# Patient Record
Sex: Female | Born: 1965 | Race: White | Hispanic: No | Smoking: Former smoker
Health system: Southern US, Community
[De-identification: ages and names within clinical notes are randomized; demographics above are authoritative.]

## PROBLEM LIST (undated history)

## (undated) DIAGNOSIS — Z87442 Personal history of urinary calculi: Secondary | ICD-10-CM

## (undated) DIAGNOSIS — Z8489 Family history of other specified conditions: Secondary | ICD-10-CM

## (undated) DIAGNOSIS — F32A Depression, unspecified: Secondary | ICD-10-CM

## (undated) DIAGNOSIS — M1990 Unspecified osteoarthritis, unspecified site: Secondary | ICD-10-CM

## (undated) HISTORY — PX: FRACTURE SURGERY: SHX138

## (undated) HISTORY — PX: WISDOM TOOTH EXTRACTION: SHX21

## (undated) HISTORY — PX: CHOLECYSTECTOMY: SHX55

## (undated) HISTORY — PX: ANKLE FRACTURE SURGERY: SHX122

---

## 2018-04-27 DIAGNOSIS — M546 Pain in thoracic spine: Secondary | ICD-10-CM | POA: Diagnosis not present

## 2018-04-27 DIAGNOSIS — M9902 Segmental and somatic dysfunction of thoracic region: Secondary | ICD-10-CM | POA: Diagnosis not present

## 2018-04-27 DIAGNOSIS — M5414 Radiculopathy, thoracic region: Secondary | ICD-10-CM | POA: Diagnosis not present

## 2018-06-15 DIAGNOSIS — L989 Disorder of the skin and subcutaneous tissue, unspecified: Secondary | ICD-10-CM | POA: Diagnosis not present

## 2018-06-15 DIAGNOSIS — E559 Vitamin D deficiency, unspecified: Secondary | ICD-10-CM | POA: Diagnosis not present

## 2018-06-15 DIAGNOSIS — E785 Hyperlipidemia, unspecified: Secondary | ICD-10-CM | POA: Diagnosis not present

## 2021-02-25 ENCOUNTER — Other Ambulatory Visit: Payer: Self-pay | Admitting: Orthopedic Surgery

## 2021-02-25 ENCOUNTER — Other Ambulatory Visit (HOSPITAL_COMMUNITY): Payer: Self-pay | Admitting: Orthopedic Surgery

## 2021-02-25 DIAGNOSIS — M75101 Unspecified rotator cuff tear or rupture of right shoulder, not specified as traumatic: Secondary | ICD-10-CM

## 2021-02-25 DIAGNOSIS — M25562 Pain in left knee: Secondary | ICD-10-CM

## 2021-02-25 DIAGNOSIS — S82142A Displaced bicondylar fracture of left tibia, initial encounter for closed fracture: Secondary | ICD-10-CM

## 2021-03-02 ENCOUNTER — Ambulatory Visit
Admission: RE | Admit: 2021-03-02 | Discharge: 2021-03-02 | Disposition: A | Discharge status: Home / Self Care | Payer: BLUE CROSS/BLUE SHIELD | Source: Ambulatory Visit | Attending: Orthopedic Surgery | Admitting: Orthopedic Surgery

## 2021-03-02 ENCOUNTER — Other Ambulatory Visit: Payer: Self-pay

## 2021-03-02 DIAGNOSIS — M25562 Pain in left knee: Secondary | ICD-10-CM

## 2021-03-02 DIAGNOSIS — S82142A Displaced bicondylar fracture of left tibia, initial encounter for closed fracture: Secondary | ICD-10-CM | POA: Insufficient documentation

## 2021-03-02 DIAGNOSIS — M75101 Unspecified rotator cuff tear or rupture of right shoulder, not specified as traumatic: Secondary | ICD-10-CM | POA: Diagnosis not present

## 2021-03-02 IMAGING — MR MR KNEE*L* W/O CM
6 series · 40 of 40 positions shown · non-contrast
Comparison: Left knee x-ray report dated [DATE].

CLINICAL DATA: Chronic intermittent left knee pain. No prior
surgery.

EXAM:
MRI OF THE LEFT KNEE WITHOUT CONTRAST
TECHNIQUE: Multiplanar, multisequence MR imaging of the knee was performed. No
intravenous contrast was administered.

[Series 8: T2 fat-sat · axial · left · 4.0mm · 0.50mm/px · z∈[-90,+34]mm · 6 of 26 slices shown (1 of 3)]
[im 1/26]
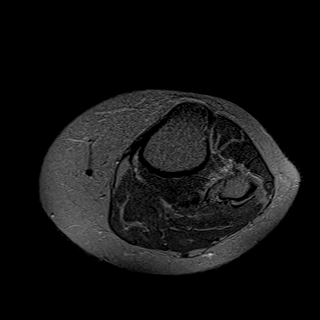
[im 6/26]
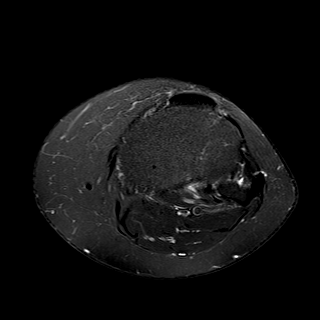
[im 11/26]
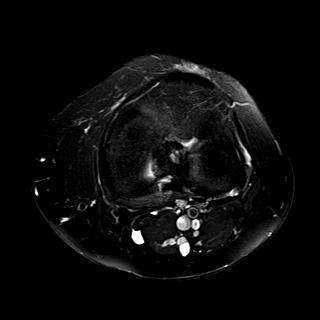
[im 16/26]
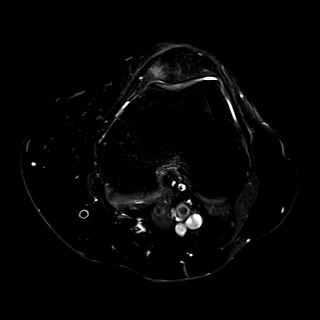
[im 21/26]
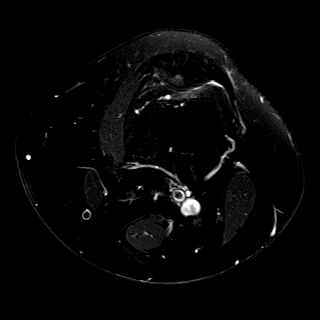
[im 26/26]
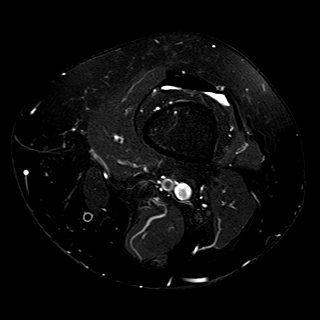

[Series 9: T2 fat-sat · coronal · left · 4.0mm · 0.59mm/px · 7 of 23 slices shown (2 of 3)]
[im 1/23]
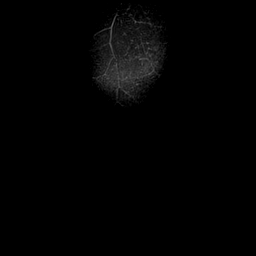
[im 4/23]
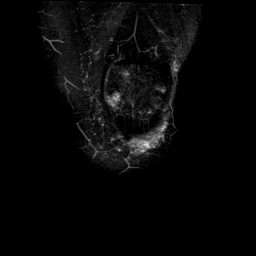
[im 8/23]
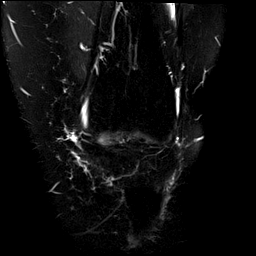
[im 12/23]
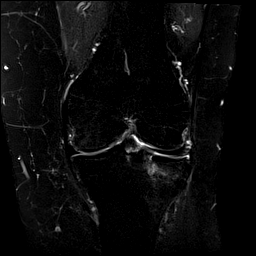
[im 15/23]
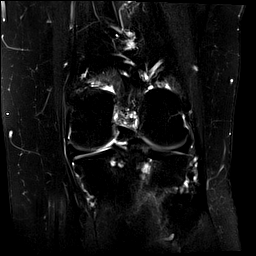
[im 19/23]
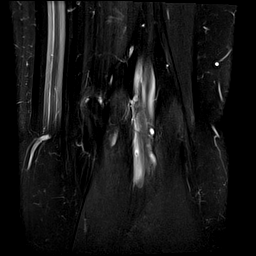
[im 23/23]
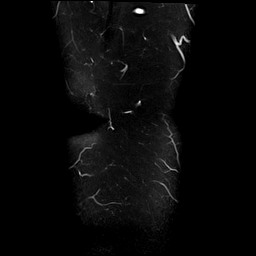

[Series 10: T1 · coronal · left · 4.0mm · 0.59mm/px · 7 of 24 slices shown]
[im 1/24]
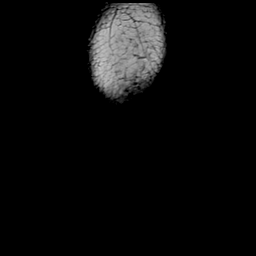
[im 4/24]
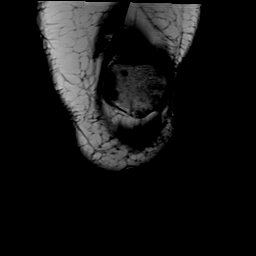
[im 8/24]
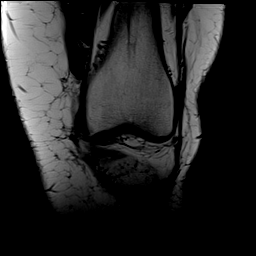
[im 12/24]
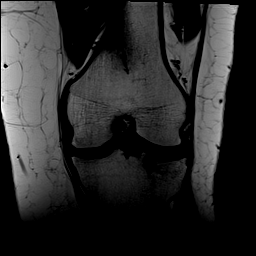
[im 16/24]
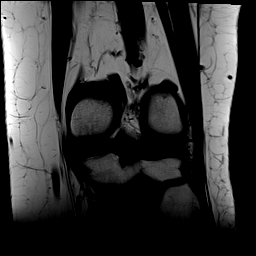
[im 20/24]
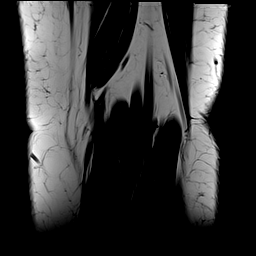
[im 24/24]
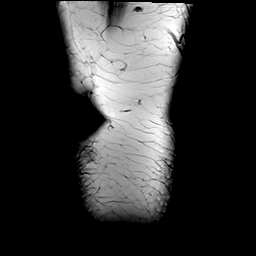

[Series 11: PD fat-sat · coronal · left · 4.0mm · 0.59mm/px · 7 of 24 slices shown]
[im 1/24]
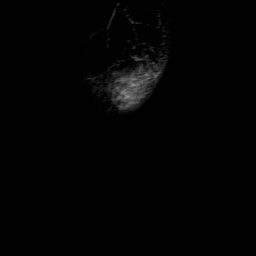
[im 4/24]
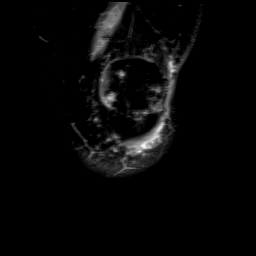
[im 8/24]
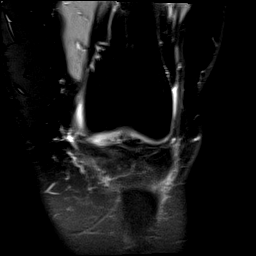
[im 12/24]
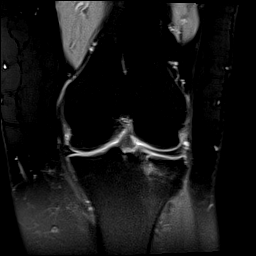
[im 16/24]
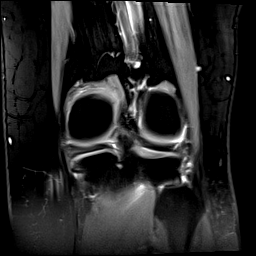
[im 20/24]
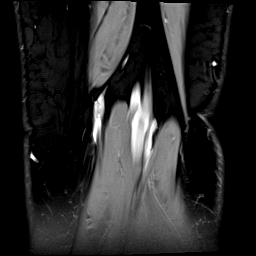
[im 24/24]
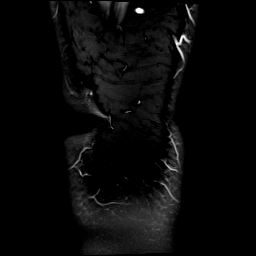

[Series 13: T2 fat-sat · sagittal · left · 3.0mm · 0.59mm/px · 8 of 28 slices shown (3 of 3)]
[im 1/28]
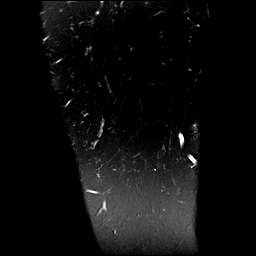
[im 4/28]
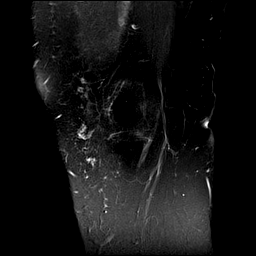
[im 8/28]
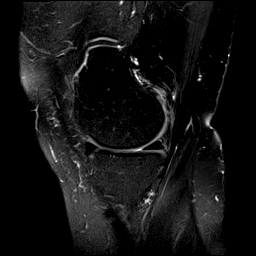
[im 12/28]
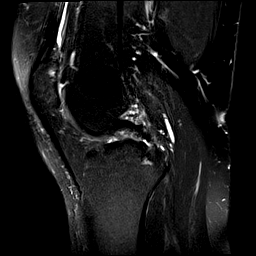
[im 16/28]
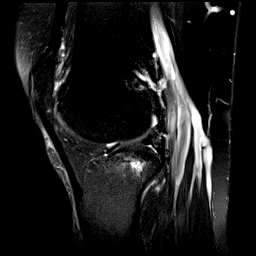
[im 20/28]
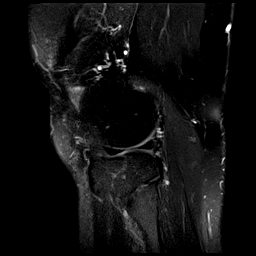
[im 24/28]
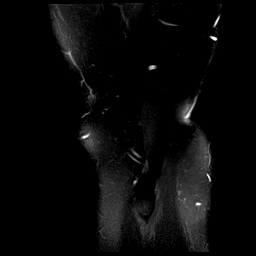
[im 28/28]
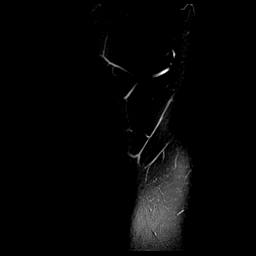

[Series 14: PD · coronal · left · 2.0mm · 0.47mm/px · 5 of 16 slices shown]
[im 1/16]
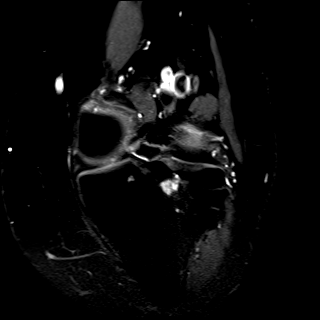
[im 4/16]
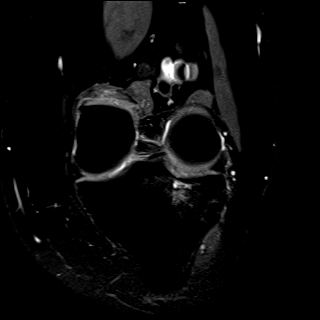
[im 8/16]
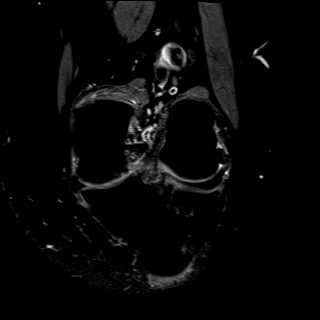
[im 12/16]
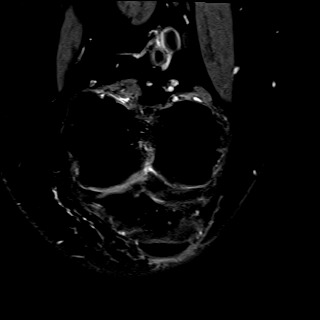
[im 16/16]
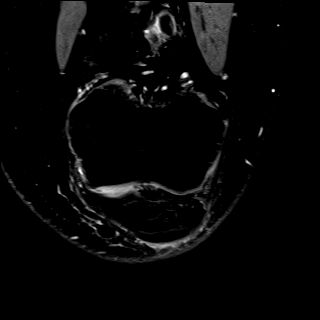

[40 of 40 positions shown; findings below may reference images not displayed]

FINDINGS: MENISCI

Medial meniscus:  Intact.

Lateral meniscus:  Intact.

LIGAMENTS

Cruciates:  Intact ACL and PCL.

Collaterals: Medial collateral ligament is intact. Lateral
collateral ligament complex is intact.

CARTILAGE

Patellofemoral: Scattered near full-thickness cartilage loss over
the medial and lateral patellar facets.

Medial: Mild partial-thickness cartilage loss over the central
weight-bearing medial femoral condyle.

Lateral:  No chondral defect.

Joint:  No joint effusion. Normal Hoffa's fat. No plical thickening.

Popliteal Fossa:  Tiny Baker cyst.  Intact popliteus tendon.

Extensor Mechanism: Intact quadriceps tendon and patellar tendon.
Intact medial and lateral patellar retinaculum. Intact MPFL.

Bones: Chronic appearing minimally depressed fracture of the
anterior lateral tibial plateau. 1.2 cm intraosseous ganglion cyst
in the posterior aspect of the lateral tibial plateau with mild
surrounding reactive marrow edema. No acute fracture or dislocation.
No suspicious bone lesion.

Other: None.
IMPRESSION: 1. Chronic appearing minimally depressed fracture of the anterior
lateral tibial plateau.
2. No meniscal or ligamentous injury.
3. Mild medial and patellofemoral compartment osteoarthritis.
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
## 2021-05-21 ENCOUNTER — Inpatient Hospital Stay: Admission: RE | Admit: 2021-05-21 | Payer: BLUE CROSS/BLUE SHIELD | Source: Ambulatory Visit

## 2021-06-27 ENCOUNTER — Other Ambulatory Visit: Payer: Self-pay | Admitting: Surgery

## 2021-07-07 ENCOUNTER — Other Ambulatory Visit: Payer: Self-pay

## 2021-07-07 ENCOUNTER — Encounter
Admission: RE | Admit: 2021-07-07 | Discharge: 2021-07-07 | Disposition: A | Discharge status: Home / Self Care | Payer: BLUE CROSS/BLUE SHIELD | Source: Ambulatory Visit | Attending: Surgery | Admitting: Surgery

## 2021-07-07 HISTORY — DX: Personal history of urinary calculi: Z87.442

## 2021-07-07 NOTE — Patient Instructions (Addendum)
Your procedure is scheduled on: 07/16/2021  Report to the Registration Desk on the 1st floor of the [facility]. To find out your arrival time, please call [phone] between 1PM - 3PM on: 07/15/2021  REMEMBER: Instructions that are not followed completely may result in serious medical risk, up to and including death; or upon the discretion of your surgeon and anesthesiologist your surgery may need to be rescheduled.  Do not eat food after midnight the night before surgery.  No gum chewing, lozengers or hard candies.  You may however, drink CLEAR liquids up to 2 hours before you are scheduled to arrive for your surgery. Do not drink anything within 2 hours of your scheduled arrival time.  Clear liquids include: - water  - apple juice without pulp - gatorade (not RED, PURPLE, OR BLUE) - black coffee or tea (Do NOT add milk or creamers to the coffee or tea) Do NOT drink anything that is not on this list.   In addition, your doctor has ordered for you to drink the provided  Ensure Pre-Surgery Clear Carbohydrate Drink  Drinking this carbohydrate drink up to two hours before surgery helps to reduce insulin resistance and improve patient outcomes. Please complete drinking 2 hours prior to scheduled arrival time.   One week prior to surgery: Stop Anti-inflammatories (NSAIDS) such as Advil, Aleve, Ibuprofen, Motrin, Naproxen, Naprosyn and Aspirin based products such as Excedrin, Goodys Powder, BC Powder and Mobic  Stop ANY OVER THE COUNTER supplements until after surgery. You may however, continue to take Tylenol if needed for pain up until the day of surgery.  Take this medication the morning of surgery: Flexeril Lexapro Gabapentin    No Alcohol for 24 hours before or after surgery.  No Smoking including e-cigarettes for 24 hours prior to surgery.  No chewable tobacco products for at least 6 hours prior to surgery.  No nicotine patches on the day of surgery.  Do not use any  "recreational" drugs for at least a week prior to your surgery.  Please be advised that the combination of cocaine and anesthesia may have negative outcomes, up to and including death. If you test positive for cocaine, your surgery will be cancelled.  On the morning of surgery brush your teeth with toothpaste and water, you may rinse your mouth with mouthwash if you wish. Do not swallow any toothpaste or mouthwash.  Use CHG Soap or wipes as directed on instruction sheet.  Do not wear jewelry, make-up, hairpins, clips or nail polish.  Do not wear lotions, powders, or perfumes.   Do not shave body from the neck down 48 hours prior to surgery just in case you cut yourself which could leave a site for infection.  Also, freshly shaved skin may become irritated if using the CHG soap.  Contact lenses, hearing aids and dentures may not be worn into surgery.  Do not bring valuables to the hospital. [facility] is not responsible for any missing/lost belongings or valuables.    Notify your doctor if there is any change in your medical condition (cold, fever, infection).  Wear comfortable clothing (specific to your surgery type) to the hospital.  After surgery, you can help prevent lung complications by doing breathing exercises.  Take deep breaths and cough every 1-2 hours. Your doctor may order a device called an Incentive Spirometer to help you take deep breaths.  If you are being admitted to the hospital overnight, leave your suitcase in the car. After surgery it may  be brought to your room.  If you are being discharged the day of surgery, you will not be allowed to drive home. You will need a responsible adult (18 years or older) to drive you home and stay with you that night.   If you are taking public transportation, you will need to have a responsible adult (18 years or older) with you. Please confirm with your physician that it is acceptable to use public transportation.   Please  call the Pre-admissions Testing Dept. at [phone] if you have any questions about these instructions.  Surgery Visitation Policy:  Patients undergoing a surgery or procedure may have one family member or support person with them as long as that person is not COVID-19 positive or experiencing its symptoms.  That person may remain in the waiting area during the procedure and may rotate out with other people.

## 2021-07-14 NOTE — Progress Notes (Signed)
Call to patient to see if she was coming to pre-admit testing department to pick up her pre-op bag with surgical instructions, soap and drink. Patient stated that she was going to put off surgery until maybe February 2023. Stated that she was about to call Dr. Binnie Rail office to let them know.

## 2021-07-16 ENCOUNTER — Encounter: Admission: RE | Payer: Self-pay | Source: Ambulatory Visit

## 2021-07-16 ENCOUNTER — Ambulatory Visit: Admission: RE | Admit: 2021-07-16 | Payer: BLUE CROSS/BLUE SHIELD | Source: Ambulatory Visit | Admitting: Surgery

## 2021-07-16 SURGERY — SHOULDER ARTHROSCOPY WITH SUBACROMIAL DECOMPRESSION, ROTATOR CUFF REPAIR AND BICEP TENDON REPAIR
Anesthesia: Choice | Site: Shoulder | Laterality: Right

## 2021-09-01 ENCOUNTER — Other Ambulatory Visit: Payer: Self-pay | Admitting: Surgery

## 2021-09-11 ENCOUNTER — Other Ambulatory Visit: Payer: Self-pay

## 2021-09-11 ENCOUNTER — Other Ambulatory Visit
Admission: RE | Admit: 2021-09-11 | Discharge: 2021-09-11 | Disposition: A | Discharge status: Home / Self Care | Payer: BLUE CROSS/BLUE SHIELD | Source: Ambulatory Visit | Attending: Surgery | Admitting: Surgery

## 2021-09-11 HISTORY — DX: Unspecified osteoarthritis, unspecified site: M19.90

## 2021-09-11 HISTORY — DX: Depression, unspecified: F32.A

## 2021-09-11 NOTE — Patient Instructions (Addendum)
Your procedure is scheduled on: 09/18/21 - Thursday Report to the Registration Desk on the 1st floor of the [facility]. To find out your arrival time, please call [phone] between 1PM - 3PM on: 09/17/21 - Wednesday  REMEMBER: Instructions that are not followed completely may result in serious medical risk, up to and including death; or upon the discretion of your surgeon and anesthesiologist your surgery may need to be rescheduled.  Do not eat food after midnight the night before surgery.  No gum chewing, lozengers or hard candies.  You may however, drink CLEAR liquids up to 2 hours before you are scheduled to arrive for your surgery. Do not drink anything within 2 hours of your scheduled arrival time.  Clear liquids include: - water  - apple juice without pulp - gatorade (not RED, PURPLE, OR BLUE) - black coffee or tea (Do NOT add milk or creamers to the coffee or tea) Do NOT drink anything that is not on this list.  Type 1 and Type 2 diabetics should only drink water.  In addition, your doctor has ordered for you to drink the provided  Ensure Pre-Surgery Clear Carbohydrate Drink  Drinking this carbohydrate drink up to two hours before surgery helps to reduce insulin resistance and improve patient outcomes. Please complete drinking 2 hours prior to scheduled arrival time.  TAKE THESE MEDICATIONS THE MORNING OF SURGERY WITH A SIP OF WATER: NONE  One week prior to surgery: Stop Anti-inflammatories (NSAIDS) such as Advil, Aleve, Ibuprofen, Motrin, Naproxen, Naprosyn and Aspirin based products such as Excedrin, Goodys Powder, BC Powder.  Stop ANY OVER THE COUNTER supplements until after surgery.  You may however, continue to take Tylenol if needed for pain up until the day of surgery.  No Alcohol for 24 hours before or after surgery.  No Smoking including e-cigarettes for 24 hours prior to surgery.  No chewable tobacco products for at least 6 hours prior to surgery.  No  nicotine patches on the day of surgery.  Do not use any "recreational" drugs for at least a week prior to your surgery.  Please be advised that the combination of cocaine and anesthesia may have negative outcomes, up to and including death. If you test positive for cocaine, your surgery will be cancelled.  On the morning of surgery brush your teeth with toothpaste and water, you may rinse your mouth with mouthwash if you wish. Do not swallow any toothpaste or mouthwash.  Do not wear jewelry, make-up, hairpins, clips or nail polish.  Do not wear lotions, powders, or perfumes.   Do not shave body from the neck down 48 hours prior to surgery just in case you cut yourself which could leave a site for infection.  Also, freshly shaved skin may become irritated if using the CHG soap.  Contact lenses, hearing aids and dentures may not be worn into surgery.  Do not bring valuables to the hospital. [facility] is not responsible for any missing/lost belongings or valuables.   Notify your doctor if there is any change in your medical condition (cold, fever, infection).  Wear comfortable clothing (specific to your surgery type) to the hospital.  After surgery, you can help prevent lung complications by doing breathing exercises.  Take deep breaths and cough every 1-2 hours. Your doctor may order a device called an Incentive Spirometer to help you take deep breaths. When coughing or sneezing, hold a pillow firmly against your incision with both hands. This is called splinting. Doing this  helps protect your incision. It also decreases belly discomfort.  If you are being admitted to the hospital overnight, leave your suitcase in the car. After surgery it may be brought to your room.  If you are being discharged the day of surgery, you will not be allowed to drive home. You will need a responsible adult (18 years or older) to drive you home and stay with you that night.   If you are taking public  transportation, you will need to have a responsible adult (18 years or older) with you. Please confirm with your physician that it is acceptable to use public transportation.   Please call the Pre-admissions Testing Dept. at [phone] if you have any questions about these instructions.  Surgery Visitation Policy:  Patients undergoing a surgery or procedure may have one family member or support person with them as long as that person is not COVID-19 positive or experiencing its symptoms.  That person may remain in the waiting area during the procedure and may rotate out with other people.  Inpatient Visitation:    Visiting hours are 7 a.m. to 8 p.m. Up to two visitors ages 16+ are allowed at one time in a patient room. The visitors may rotate out with other people during the day. Visitors must check out when they leave, or other visitors will not be allowed. One designated support person may remain overnight. The visitor must pass COVID-19 screenings, use hand sanitizer when entering and exiting the patients room and wear a mask at all times, including in the patients room. Patients must also wear a mask when staff or their visitor are in the room. Masking is required regardless of vaccination status.   Transportation Services:    - [facility] - [phone]  - [place]

## 2021-09-18 ENCOUNTER — Emergency Department: Payer: BLUE CROSS/BLUE SHIELD

## 2021-09-18 ENCOUNTER — Ambulatory Visit: Payer: BLUE CROSS/BLUE SHIELD | Admitting: Certified Registered"

## 2021-09-18 ENCOUNTER — Encounter
Admission: RE | Disposition: A | Discharge status: Home / Self Care | Payer: Self-pay | Source: Ambulatory Visit | Attending: Surgery

## 2021-09-18 ENCOUNTER — Ambulatory Visit
Admission: RE | Admit: 2021-09-18 | Discharge: 2021-09-18 | Disposition: A | Discharge status: Home / Self Care | Payer: BLUE CROSS/BLUE SHIELD | Source: Ambulatory Visit | Attending: Surgery | Admitting: Surgery

## 2021-09-18 ENCOUNTER — Emergency Department
Admission: EM | Admit: 2021-09-18 | Discharge: 2021-09-18 | Disposition: A | Discharge status: Home / Self Care | Payer: BLUE CROSS/BLUE SHIELD | Source: Home / Self Care

## 2021-09-18 ENCOUNTER — Encounter: Payer: Self-pay | Admitting: *Deleted

## 2021-09-18 ENCOUNTER — Encounter: Payer: Self-pay | Admitting: Surgery

## 2021-09-18 ENCOUNTER — Other Ambulatory Visit: Payer: Self-pay

## 2021-09-18 ENCOUNTER — Ambulatory Visit: Payer: BLUE CROSS/BLUE SHIELD

## 2021-09-18 DIAGNOSIS — M25511 Pain in right shoulder: Secondary | ICD-10-CM | POA: Insufficient documentation

## 2021-09-18 DIAGNOSIS — S46011A Strain of muscle(s) and tendon(s) of the rotator cuff of right shoulder, initial encounter: Secondary | ICD-10-CM | POA: Diagnosis not present

## 2021-09-18 DIAGNOSIS — F32A Depression, unspecified: Secondary | ICD-10-CM | POA: Insufficient documentation

## 2021-09-18 DIAGNOSIS — Z5321 Procedure and treatment not carried out due to patient leaving prior to being seen by health care provider: Secondary | ICD-10-CM | POA: Insufficient documentation

## 2021-09-18 DIAGNOSIS — R0602 Shortness of breath: Secondary | ICD-10-CM | POA: Insufficient documentation

## 2021-09-18 DIAGNOSIS — M7061 Trochanteric bursitis, right hip: Secondary | ICD-10-CM | POA: Insufficient documentation

## 2021-09-18 DIAGNOSIS — R079 Chest pain, unspecified: Secondary | ICD-10-CM | POA: Insufficient documentation

## 2021-09-18 DIAGNOSIS — Z87828 Personal history of other (healed) physical injury and trauma: Secondary | ICD-10-CM | POA: Insufficient documentation

## 2021-09-18 DIAGNOSIS — M7521 Bicipital tendinitis, right shoulder: Secondary | ICD-10-CM | POA: Diagnosis not present

## 2021-09-18 DIAGNOSIS — Z8616 Personal history of COVID-19: Secondary | ICD-10-CM | POA: Diagnosis not present

## 2021-09-18 DIAGNOSIS — Z419 Encounter for procedure for purposes other than remedying health state, unspecified: Secondary | ICD-10-CM

## 2021-09-18 DIAGNOSIS — M7541 Impingement syndrome of right shoulder: Secondary | ICD-10-CM | POA: Diagnosis present

## 2021-09-18 HISTORY — PX: SHOULDER ARTHROSCOPY WITH DEBRIDEMENT AND BICEP TENDON REPAIR: SHX5690

## 2021-09-18 HISTORY — PX: STERIOD INJECTION: SHX5046

## 2021-09-18 LAB — BASIC METABOLIC PANEL
Anion gap: 12 (ref 5–15)
BUN: 15 mg/dL (ref 6–20)
CO2: 21 mmol/L — ABNORMAL LOW (ref 22–32)
Calcium: 9.1 mg/dL (ref 8.9–10.3)
Chloride: 104 mmol/L (ref 98–111)
Creatinine, Ser: 0.88 mg/dL (ref 0.44–1.00)
GFR, Estimated: >60 mL/min (ref >60)
Glucose, Bld: 250 mg/dL — ABNORMAL HIGH (ref 70–99)
Potassium: 3.8 mmol/L (ref 3.5–5.1)
Sodium: 137 mmol/L (ref 135–145)

## 2021-09-18 LAB — CBC
HCT: 42.2 % (ref 36.0–46.0)
Hemoglobin: 14.1 g/dL (ref 12.0–15.0)
MCH: 30.1 pg (ref 26.0–34.0)
MCHC: 33.4 g/dL (ref 30.0–36.0)
MCV: 90 fL (ref 80.0–100.0)
Platelets: 315 10*3/uL (ref 150–400)
RBC: 4.69 MIL/uL (ref 3.87–5.11)
RDW: 12.8 % (ref 11.5–15.5)
WBC: 12.4 10*3/uL — ABNORMAL HIGH (ref 4.0–10.5)
nRBC: 0 % (ref 0.0–0.2)

## 2021-09-18 LAB — TROPONIN I (HIGH SENSITIVITY): Troponin I (High Sensitivity): 3 ng/L (ref <18)

## 2021-09-18 IMAGING — CR DG CHEST 1V
1 series · 1 of 1 positions shown · non-contrast
Comparison: None.

CLINICAL DATA: Dyspnea, right shoulder surgery

EXAM:
CHEST  1 VIEW

[dg chest 2 view]
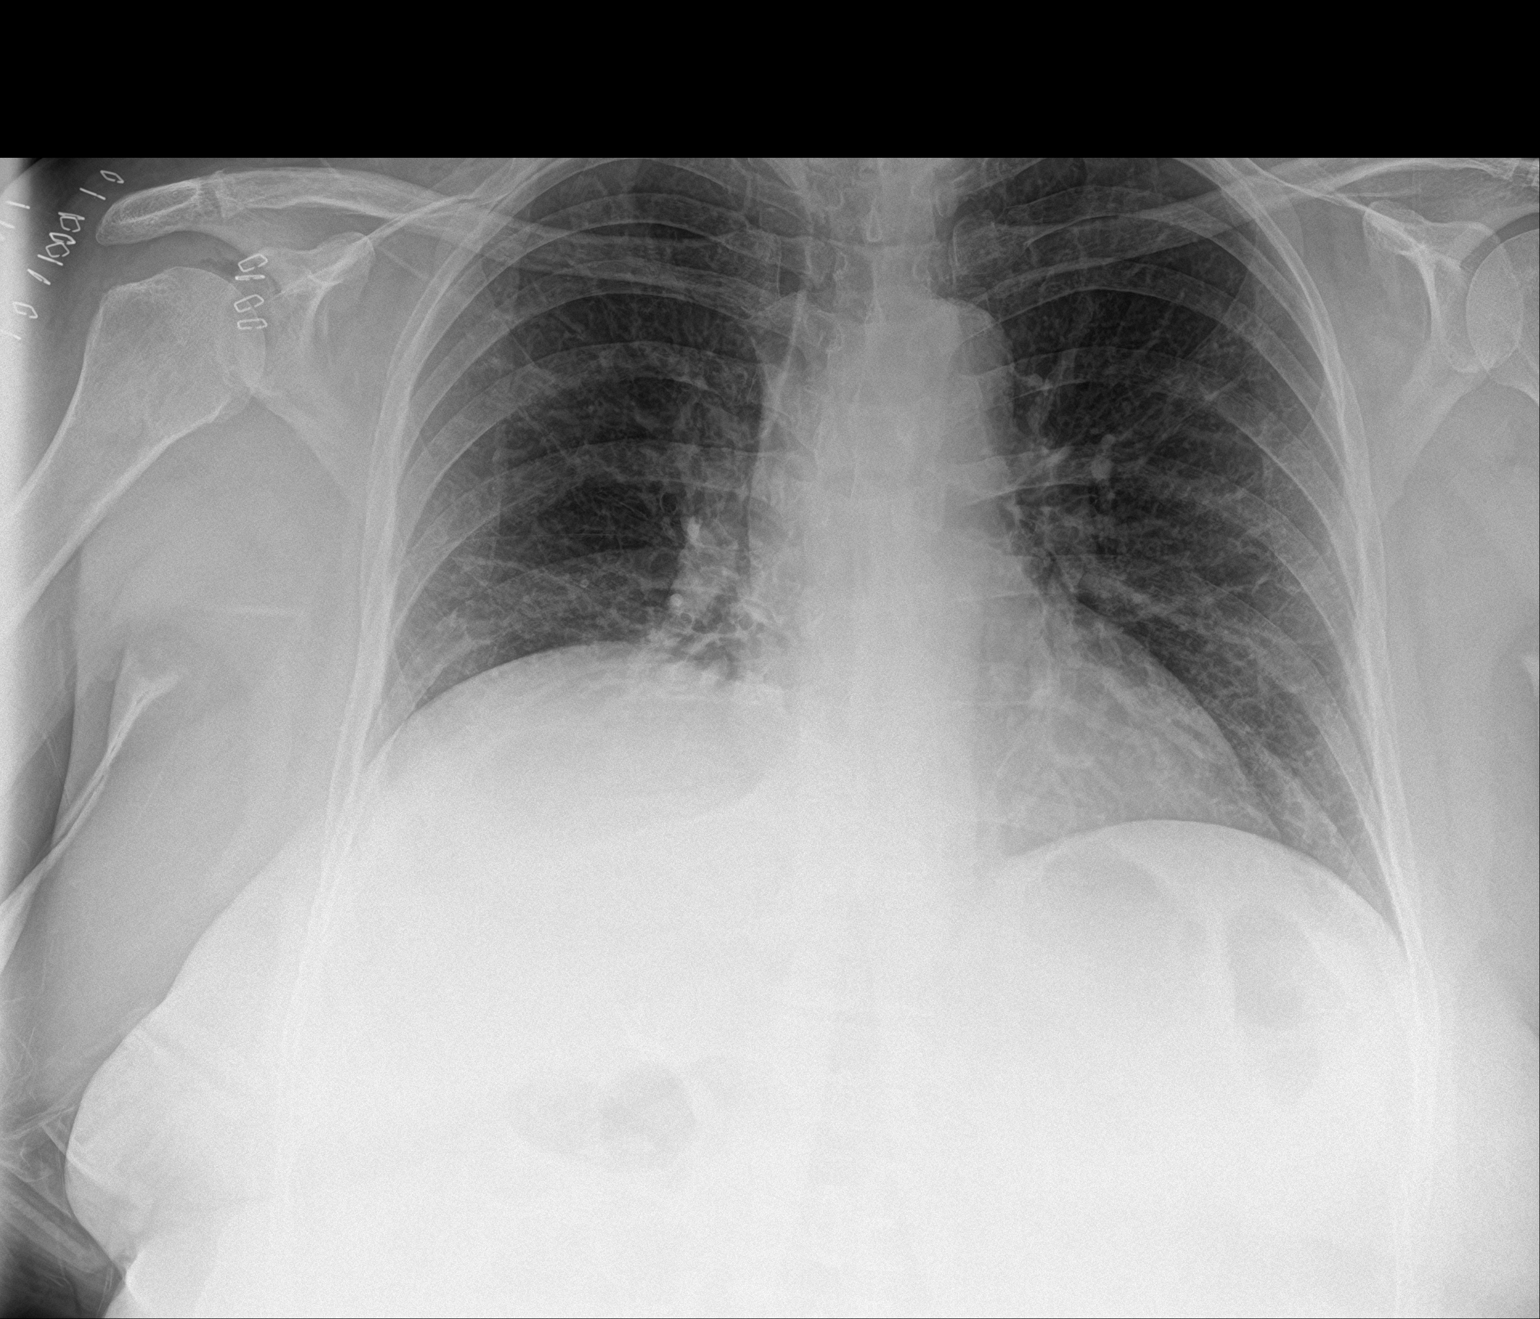

[1 of 1 positions shown; findings below may reference images not displayed]

FINDINGS: Mild elevation right hemidiaphragm noted with associated mild right
basilar atelectasis. Lungs are otherwise clear. No pneumothorax or
pleural effusion. Cardiac size within normal limits. Pulmonary
vascularity is normal. Surgical skin staples overlie the right
shoulder. Intra-articular gas is seen within the right glenohumeral
joint.
IMPRESSION: Mild elevation of the right hemidiaphragm with right basilar
atelectasis.

## 2021-09-18 SURGERY — SHOULDER ARTHROSCOPY WITH DEBRIDEMENT AND BICEP TENDON REPAIR
Anesthesia: General | Site: Shoulder | Laterality: Right

## 2021-09-18 MED ORDER — BUPIVACAINE HCL (PF) 0.5 % IJ SOLN
INTRAMUSCULAR | Status: DC | PRN
  Administered 2021-09-18: 10 mL via PERINEURAL

## 2021-09-18 MED ORDER — ONDANSETRON HCL 4 MG/2ML IJ SOLN: 4.0000 mg | Freq: Four times a day (QID) | INTRAMUSCULAR | Status: DC | PRN

## 2021-09-18 MED ORDER — RINGERS IRRIGATION IR SOLN
Status: DC | PRN
  Administered 2021-09-18: 3000 mL

## 2021-09-18 MED ORDER — SODIUM CHLORIDE 0.9 % IV SOLN: INTRAVENOUS | Status: DC

## 2021-09-18 MED ORDER — ACETAMINOPHEN 500 MG PO TABS: 1000.0000 mg | ORAL_TABLET | Freq: Once | ORAL | Status: AC

## 2021-09-18 MED ORDER — LIDOCAINE HCL (PF) 1 % IJ SOLN
INTRAMUSCULAR | Status: DC | PRN
  Administered 2021-09-18: 1 mL via SUBCUTANEOUS

## 2021-09-18 MED ORDER — ACETAMINOPHEN 500 MG PO TABS
ORAL_TABLET | ORAL | Status: AC
  Administered 2021-09-18: 1000 mg via ORAL
  Filled 2021-09-18: qty 2

## 2021-09-18 MED ORDER — OXYCODONE HCL 5 MG PO TABS: 5.0000 mg | ORAL_TABLET | Freq: Once | ORAL | Status: DC | PRN

## 2021-09-18 MED ORDER — TRIAMCINOLONE ACETONIDE 40 MG/ML IJ SUSP
INTRAMUSCULAR | Status: AC
Start: 2021-09-18 — End: ?
  Filled 2021-09-18: qty 1

## 2021-09-18 MED ORDER — BUPIVACAINE-EPINEPHRINE 0.5% -1:200000 IJ SOLN
INTRAMUSCULAR | Status: DC | PRN
Start: 2021-09-18 — End: 2021-09-18
  Administered 2021-09-18: 4 mL
  Administered 2021-09-18: 26 mL

## 2021-09-18 MED ORDER — MIDAZOLAM HCL 2 MG/2ML IJ SOLN: 1.0000 mg | Freq: Once | INTRAMUSCULAR | Status: AC

## 2021-09-18 MED ORDER — BUPIVACAINE HCL (PF) 0.5 % IJ SOLN
INTRAMUSCULAR | Status: AC
  Filled 2021-09-18: qty 10

## 2021-09-18 MED ORDER — MIDAZOLAM HCL 2 MG/2ML IJ SOLN
INTRAMUSCULAR | Status: AC
  Administered 2021-09-18: 1 mg via INTRAVENOUS
  Filled 2021-09-18: qty 2

## 2021-09-18 MED ORDER — CHLORHEXIDINE GLUCONATE 0.12 % MT SOLN: 15.0000 mL | Freq: Once | OROMUCOSAL | Status: AC

## 2021-09-18 MED ORDER — FAMOTIDINE 20 MG PO TABS: 20.0000 mg | ORAL_TABLET | Freq: Once | ORAL | Status: AC

## 2021-09-18 MED ORDER — OXYCODONE HCL 5 MG/5ML PO SOLN: 5.0000 mg | Freq: Once | ORAL | Status: DC | PRN

## 2021-09-18 MED ORDER — ONDANSETRON HCL 4 MG/2ML IJ SOLN
INTRAMUSCULAR | Status: DC | PRN
  Administered 2021-09-18 (×2): 4 mg via INTRAVENOUS

## 2021-09-18 MED ORDER — MIDAZOLAM HCL 2 MG/2ML IJ SOLN
INTRAMUSCULAR | Status: DC | PRN
Start: 2021-09-18 — End: 2021-09-18
  Administered 2021-09-18: 2 mg via INTRAVENOUS

## 2021-09-18 MED ORDER — CEFAZOLIN SODIUM-DEXTROSE 2-4 GM/100ML-% IV SOLN
2.0000 g | INTRAVENOUS | Status: AC
  Administered 2021-09-18: 2 g via INTRAVENOUS

## 2021-09-18 MED ORDER — BUPIVACAINE LIPOSOME 1.3 % IJ SUSP
INTRAMUSCULAR | Status: AC
  Filled 2021-09-18: qty 10

## 2021-09-18 MED ORDER — PHENYLEPHRINE HCL (PRESSORS) 10 MG/ML IV SOLN
INTRAVENOUS | Status: DC | PRN
Start: 2021-09-18 — End: 2021-09-18
  Administered 2021-09-18: 80 ug via INTRAVENOUS

## 2021-09-18 MED ORDER — METOCLOPRAMIDE HCL 5 MG/ML IJ SOLN: 5.0000 mg | Freq: Three times a day (TID) | INTRAMUSCULAR | Status: DC | PRN

## 2021-09-18 MED ORDER — EPINEPHRINE PF 1 MG/ML IJ SOLN
INTRAMUSCULAR | Status: AC
  Filled 2021-09-18: qty 2

## 2021-09-18 MED ORDER — LIDOCAINE HCL (PF) 1 % IJ SOLN
INTRAMUSCULAR | Status: AC
  Filled 2021-09-18: qty 5

## 2021-09-18 MED ORDER — ONDANSETRON HCL 4 MG PO TABS: 4.0000 mg | ORAL_TABLET | Freq: Four times a day (QID) | ORAL | Status: DC | PRN

## 2021-09-18 MED ORDER — MIDAZOLAM HCL 2 MG/2ML IJ SOLN
INTRAMUSCULAR | Status: AC
  Filled 2021-09-18: qty 2

## 2021-09-18 MED ORDER — FENTANYL CITRATE (PF) 100 MCG/2ML IJ SOLN: 25.0000 ug | INTRAMUSCULAR | Status: DC | PRN

## 2021-09-18 MED ORDER — KETOROLAC TROMETHAMINE 30 MG/ML IJ SOLN
INTRAMUSCULAR | Status: AC
  Filled 2021-09-18: qty 1

## 2021-09-18 MED ORDER — BUPIVACAINE LIPOSOME 1.3 % IJ SUSP
INTRAMUSCULAR | Status: DC | PRN
  Administered 2021-09-18: 10 mL via PERINEURAL

## 2021-09-18 MED ORDER — CHLORHEXIDINE GLUCONATE 0.12 % MT SOLN
OROMUCOSAL | Status: AC
  Administered 2021-09-18: 15 mL via OROMUCOSAL
  Filled 2021-09-18: qty 15

## 2021-09-18 MED ORDER — LIDOCAINE HCL (CARDIAC) PF 100 MG/5ML IV SOSY
PREFILLED_SYRINGE | INTRAVENOUS | Status: DC | PRN
Start: 2021-09-18 — End: 2021-09-18
  Administered 2021-09-18: 80 mg via INTRAVENOUS

## 2021-09-18 MED ORDER — EPHEDRINE 5 MG/ML INJ
INTRAVENOUS | Status: AC
  Filled 2021-09-18: qty 5

## 2021-09-18 MED ORDER — ROCURONIUM BROMIDE 100 MG/10ML IV SOLN
INTRAVENOUS | Status: DC | PRN
Start: 2021-09-18 — End: 2021-09-18
  Administered 2021-09-18: 50 mg via INTRAVENOUS

## 2021-09-18 MED ORDER — BUPIVACAINE-EPINEPHRINE (PF) 0.5% -1:200000 IJ SOLN
INTRAMUSCULAR | Status: AC
  Filled 2021-09-18: qty 30

## 2021-09-18 MED ORDER — MIDAZOLAM HCL 2 MG/2ML IJ SOLN
1.0000 mg | Freq: Once | INTRAMUSCULAR | Status: AC
Start: 2021-09-18 — End: 2021-09-18
  Administered 2021-09-18: 1 mg via INTRAVENOUS

## 2021-09-18 MED ORDER — EPHEDRINE SULFATE (PRESSORS) 50 MG/ML IJ SOLN
INTRAMUSCULAR | Status: DC | PRN
  Administered 2021-09-18: 5 mg via INTRAVENOUS
  Administered 2021-09-18: 2.5 mg via INTRAVENOUS

## 2021-09-18 MED ORDER — FENTANYL CITRATE PF 50 MCG/ML IJ SOSY
PREFILLED_SYRINGE | INTRAMUSCULAR | Status: AC
  Administered 2021-09-18: 50 ug via INTRAVENOUS
  Filled 2021-09-18: qty 1

## 2021-09-18 MED ORDER — FENTANYL CITRATE (PF) 100 MCG/2ML IJ SOLN
INTRAMUSCULAR | Status: DC | PRN
Start: 2021-09-18 — End: 2021-09-18
  Administered 2021-09-18: 50 ug via INTRAVENOUS

## 2021-09-18 MED ORDER — LACTATED RINGERS IV SOLN: INTRAVENOUS | Status: DC

## 2021-09-18 MED ORDER — SUGAMMADEX SODIUM 200 MG/2ML IV SOLN
INTRAVENOUS | Status: DC | PRN
  Administered 2021-09-18: 100 mg via INTRAVENOUS

## 2021-09-18 MED ORDER — KETOROLAC TROMETHAMINE 30 MG/ML IJ SOLN
30.0000 mg | Freq: Once | INTRAMUSCULAR | Status: AC
  Administered 2021-09-18: 30 mg via INTRAVENOUS

## 2021-09-18 MED ORDER — PENTAFLUOROPROP-TETRAFLUOROETH EX AERO
INHALATION_SPRAY | CUTANEOUS | Status: AC
  Filled 2021-09-18: qty 30

## 2021-09-18 MED ORDER — DROPERIDOL 2.5 MG/ML IJ SOLN
0.6250 mg | Freq: Once | INTRAMUSCULAR | Status: DC | PRN
  Filled 2021-09-18: qty 2

## 2021-09-18 MED ORDER — CEFAZOLIN SODIUM-DEXTROSE 2-4 GM/100ML-% IV SOLN
INTRAVENOUS | Status: AC
  Filled 2021-09-18: qty 100

## 2021-09-18 MED ORDER — METOCLOPRAMIDE HCL 10 MG PO TABS: 5.0000 mg | ORAL_TABLET | Freq: Three times a day (TID) | ORAL | Status: DC | PRN

## 2021-09-18 MED ORDER — TRIAMCINOLONE ACETONIDE 40 MG/ML IJ SUSP
INTRAMUSCULAR | Status: DC | PRN
  Administered 2021-09-18: 40 mg via INTRAMUSCULAR

## 2021-09-18 MED ORDER — OXYCODONE HCL 5 MG PO TABS: 5.0000 mg | ORAL_TABLET | ORAL | Status: DC | PRN

## 2021-09-18 MED ORDER — LACTATED RINGERS IV SOLN
INTRAVENOUS | Status: DC | PRN
  Administered 2021-09-18: 1 mL

## 2021-09-18 MED ORDER — PHENYLEPHRINE HCL-NACL 20-0.9 MG/250ML-% IV SOLN
INTRAVENOUS | Status: DC | PRN
  Administered 2021-09-18: 15 ug/min via INTRAVENOUS

## 2021-09-18 MED ORDER — FENTANYL CITRATE PF 50 MCG/ML IJ SOSY: 50.0000 ug | PREFILLED_SYRINGE | Freq: Once | INTRAMUSCULAR | Status: AC

## 2021-09-18 MED ORDER — FAMOTIDINE 20 MG PO TABS: 20.0000 mg | ORAL_TABLET | Freq: Once | ORAL | Status: DC

## 2021-09-18 MED ORDER — DEXAMETHASONE SODIUM PHOSPHATE 10 MG/ML IJ SOLN
INTRAMUSCULAR | Status: DC | PRN
  Administered 2021-09-18: 10 mg via INTRAVENOUS

## 2021-09-18 MED ORDER — FENTANYL CITRATE (PF) 100 MCG/2ML IJ SOLN
INTRAMUSCULAR | Status: AC
  Filled 2021-09-18: qty 2

## 2021-09-18 MED ORDER — ACETAMINOPHEN 10 MG/ML IV SOLN: 1000.0000 mg | Freq: Once | INTRAVENOUS | Status: DC | PRN

## 2021-09-18 MED ORDER — PROPOFOL 10 MG/ML IV BOLUS
INTRAVENOUS | Status: DC | PRN
  Administered 2021-09-18: 150 mg via INTRAVENOUS

## 2021-09-18 MED ORDER — GLYCOPYRROLATE 0.2 MG/ML IJ SOLN
INTRAMUSCULAR | Status: DC | PRN
  Administered 2021-09-18: .2 mg via INTRAVENOUS

## 2021-09-18 MED ORDER — FAMOTIDINE 20 MG PO TABS
ORAL_TABLET | ORAL | Status: AC
  Administered 2021-09-18: 20 mg via ORAL
  Filled 2021-09-18: qty 1

## 2021-09-18 MED ORDER — OXYCODONE HCL 5 MG PO TABS
5.0000 mg | ORAL_TABLET | ORAL | 0 refills | Status: DC | PRN
Start: 2021-09-18 — End: 2022-05-07

## 2021-09-18 MED ORDER — ORAL CARE MOUTH RINSE: 15.0000 mL | Freq: Once | OROMUCOSAL | Status: AC

## 2021-09-18 SURGICAL SUPPLY — 52 items
ANCH SUT 2 2/0 ABS BRD STRL (Anchor) ×3 IMPLANT
ANCH SUT 2 JK 1.5X2.9 2 LD (Anchor) ×3 IMPLANT
ANCHOR SUT JK SZ 2 2.9 DBL SL (Anchor) ×2 IMPLANT
ANCHOR SUT W/ ORTHOCORD (Anchor) ×2 IMPLANT
APL PRP STRL LF DISP 70% ISPRP (MISCELLANEOUS) ×3
BIT DRILL JUGRKNT W/NDL BIT2.9 (DRILL) ×1 IMPLANT
BLADE FULL RADIUS 3.5 (BLADE) ×4 IMPLANT
BUR ACROMIONIZER 4.0 (BURR) ×4 IMPLANT
CANNULA SHAVER 8MMX76MM (CANNULA) ×4 IMPLANT
CHLORAPREP W/TINT 26 (MISCELLANEOUS) ×4 IMPLANT
COVER MAYO STAND REUSABLE (DRAPES) ×4 IMPLANT
DILATOR 5.5 THREADED HEALICOIL (MISCELLANEOUS) IMPLANT
DRILL JUGGERKNOT W/NDL BIT 2.9 (DRILL) ×4
ELECT CAUTERY BLADE 6.4 (BLADE) ×4 IMPLANT
ELECT REM PT RETURN 9FT ADLT (ELECTROSURGICAL) ×4
ELECTRODE REM PT RTRN 9FT ADLT (ELECTROSURGICAL) ×3 IMPLANT
GAUZE SPONGE 4X4 12PLY STRL (GAUZE/BANDAGES/DRESSINGS) ×4 IMPLANT
GAUZE XEROFORM 1X8 LF (GAUZE/BANDAGES/DRESSINGS) ×4 IMPLANT
GLOVE SRG 8 PF TXTR STRL LF DI (GLOVE) ×3 IMPLANT
GLOVE SURG ENC MOIS LTX SZ7.5 (GLOVE) ×8 IMPLANT
GLOVE SURG ENC MOIS LTX SZ8 (GLOVE) ×8 IMPLANT
GLOVE SURG UNDER LTX SZ8 (GLOVE) ×4 IMPLANT
GLOVE SURG UNDER POLY LF SZ8 (GLOVE) ×4
GOWN STRL REUS W/ TWL LRG LVL3 (GOWN DISPOSABLE) ×3 IMPLANT
GOWN STRL REUS W/ TWL XL LVL3 (GOWN DISPOSABLE) ×3 IMPLANT
GOWN STRL REUS W/TWL LRG LVL3 (GOWN DISPOSABLE) ×4
GOWN STRL REUS W/TWL XL LVL3 (GOWN DISPOSABLE) ×4
GRASPER SUT 15 45D LOW PRO (SUTURE) ×2 IMPLANT
IV LACTATED RINGER IRRG 3000ML (IV SOLUTION) ×4
IV LR IRRIG 3000ML ARTHROMATIC (IV SOLUTION) ×5 IMPLANT
KIT CANNULA 8X76-LX IN CANNULA (CANNULA) IMPLANT
MANIFOLD NEPTUNE II (INSTRUMENTS) ×6 IMPLANT
MASK FACE SPIDER DISP (MASK) ×4 IMPLANT
MAT ABSORB  FLUID 56X50 GRAY (MISCELLANEOUS) ×1
MAT ABSORB FLUID 56X50 GRAY (MISCELLANEOUS) ×3 IMPLANT
PACK ARTHROSCOPY SHOULDER (MISCELLANEOUS) ×4 IMPLANT
PAD ABD DERMACEA PRESS 5X9 (GAUZE/BANDAGES/DRESSINGS) ×8 IMPLANT
PASSER SUT FIRSTPASS SELF (INSTRUMENTS) IMPLANT
SLING ARM LRG DEEP (SOFTGOODS) ×2 IMPLANT
SLING ULTRA II LG (MISCELLANEOUS) ×4 IMPLANT
SPONGE T-LAP 18X18 ~~LOC~~+RFID (SPONGE) ×4 IMPLANT
STAPLER SKIN PROX 35W (STAPLE) ×4 IMPLANT
STRAP SAFETY 5IN WIDE (MISCELLANEOUS) ×4 IMPLANT
SUT ETHIBOND 0 MO6 C/R (SUTURE) ×4 IMPLANT
SUT ULTRABRAID 2 COBRAID 38 (SUTURE) IMPLANT
SUT VIC AB 2-0 CT1 27 (SUTURE) ×8
SUT VIC AB 2-0 CT1 TAPERPNT 27 (SUTURE) ×6 IMPLANT
TAPE MICROFOAM 4IN (TAPE) ×4 IMPLANT
TUBING CONNECTING 10 (TUBING) ×4 IMPLANT
TUBING INFLOW SET DBFLO PUMP (TUBING) ×4 IMPLANT
WAND WEREWOLF FLOW 90D (MISCELLANEOUS) ×4 IMPLANT
WATER STERILE IRR 500ML POUR (IV SOLUTION) ×4 IMPLANT

## 2021-09-18 NOTE — Anesthesia Procedure Notes (Signed)
Procedure Name: Intubation Date/Time: 09/18/2021 10:34 AM Performed by: Kelton Pillar, CRNA Pre-anesthesia Checklist: Patient identified, Emergency Drugs available, Suction available and Patient being monitored Patient Re-evaluated:Patient Re-evaluated prior to induction Oxygen Delivery Method: Circle system utilized Preoxygenation: Pre-oxygenation with 100% oxygen Induction Type: IV induction Ventilation: Mask ventilation without difficulty Laryngoscope Size: McGraph and 3 Grade View: Grade I Tube type: Oral Number of attempts: 1 Airway Equipment and Method: Stylet and Oral airway Placement Confirmation: ETT inserted through vocal cords under direct vision, positive ETCO2, breath sounds checked- equal and bilateral and CO2 detector Secured at: 21 cm Tube secured with: Tape Dental Injury: Teeth and Oropharynx as per pre-operative assessment

## 2021-09-18 NOTE — Anesthesia Preprocedure Evaluation (Addendum)
Anesthesia Evaluation  Patient identified by MRN, date of birth, ID band Patient awake    Reviewed: Allergy & Precautions, NPO status , Patient's Chart, lab work & pertinent test results  Airway Mallampati: II  TM Distance: >3 FB Neck ROM: full    Dental no notable dental hx.    Pulmonary neg pulmonary ROS,    Pulmonary exam normal        Cardiovascular negative cardio ROS Normal cardiovascular exam     Neuro/Psych PSYCHIATRIC DISORDERS Depression negative neurological ROS     GI/Hepatic negative GI ROS, Neg liver ROS,   Endo/Other  negative endocrine ROS  Renal/GU      Musculoskeletal  (+) Arthritis , Osteoarthritis,    Abdominal (+) + obese,   Peds  Hematology negative hematology ROS (+)   Anesthesia Other Findings Rotator cuff tendinitis  Past Medical History: No date: Arthritis No date: Depression No date: History of kidney stones  Past Surgical History: No date: CHOLECYSTECTOMY No date: FRACTURE SURGERY     Reproductive/Obstetrics negative OB ROS                             Anesthesia Physical Anesthesia Plan  ASA: 3  Anesthesia Plan: General ETT   Post-op Pain Management: Regional block* and Tylenol PO (pre-op)*   Induction: Intravenous  PONV Risk Score and Plan: Ondansetron, Dexamethasone, Midazolam and Treatment may vary due to age or medical condition  Airway Management Planned:   Additional Equipment:   Intra-op Plan:   Post-operative Plan: Extubation in OR  Informed Consent: I have reviewed the patients History and Physical, chart, labs and discussed the procedure including the risks, benefits and alternatives for the proposed anesthesia with the patient or authorized representative who has indicated his/her understanding and acceptance.     Dental advisory given  Plan Discussed with: Anesthesiologist, CRNA and Surgeon  Anesthesia Plan Comments:         Anesthesia Quick Evaluation
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
## 2021-09-18 NOTE — Anesthesia Procedure Notes (Signed)
Anesthesia Regional Block: Interscalene brachial plexus block   Pre-Anesthetic Checklist: , timeout performed,  Correct Patient, Correct Site, Correct Laterality,  Correct Procedure, Correct Position, site marked,  Risks and benefits discussed,  Surgical consent,  Pre-op evaluation,  At surgeon's request and post-op pain management  Laterality: Right  Prep: chloraprep       Needles:  Injection technique: Single-shot  Needle Type: Stimiplex     Needle Length: 9cm  Needle Gauge: 22     Additional Needles:   Procedures:,,,, ultrasound used (permanent image in chart),,    Narrative:  Start time: 09/18/2021 9:26 AM End time: 09/18/2021 9:30 AM Injection made incrementally with aspirations every 20 mL.  Performed by: Personally  Anesthesiologist: Foye Deer, MD  Additional Notes: Patient consented for risk and benefits of nerve block including but not limited to nerve damage, failed block, bleeding and infection.  Patient voiced understanding.  Functioning IV was confirmed and monitors were applied.  Timeout done prior to procedure and prior to any sedation being given to the patient.  Patient confirmed procedure site prior to any sedation given to the patient.  A 69mm 22ga Stimuplex needle was used. Sterile prep,hand hygiene and sterile gloves were used.  Minimal sedation used for procedure.  No paresthesia endorsed by patient during the procedure.  Negative aspiration and negative test dose prior to incremental administration of local anesthetic. The patient tolerated the procedure well with no immediate complications.

## 2021-09-18 NOTE — ED Triage Notes (Signed)
Pt to traige via wheelchair.  Pt had surgery today on right shoulder by dr Joice Lofts.  Pt now has chest pain/pressure and sob this evening.  No n/v  pt alert  speech clear.  Pt has immobilizer on right shoulder.

## 2021-09-18 NOTE — Anesthesia Postprocedure Evaluation (Signed)
Anesthesia Post Note  Patient: Keslyn Teater  Procedure(s) Performed: Extensive arthroscopic debridement, arthroscopic repair of subscapularis tendon tear, arthroscopic subacromial decompression, and mini-open biceps tenodesis, right shoulder (Right: Shoulder) STEROID INJECTION RIGHT HIP  Patient location during evaluation: PACU Anesthesia Type: General Level of consciousness: awake and alert Pain management: pain level controlled Vital Signs Assessment: post-procedure vital signs reviewed and stable Respiratory status: spontaneous breathing, nonlabored ventilation and respiratory function stable Cardiovascular status: stable and blood pressure returned to baseline Postop Assessment: no apparent nausea or vomiting Anesthetic complications: no   No notable events documented.   Last Vitals:  Vitals:   09/18/21 1245 09/18/21 1252  BP: 126/78 133/80  Pulse: 72 88  Resp: 16 14  Temp: (!) 36.2 C (!) 36.4 C  SpO2: 95% 95%    Last Pain:  Vitals:   09/18/21 1252  TempSrc: Temporal  PainSc: 0-No pain                 Foye Deer
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
## 2021-09-18 NOTE — Transfer of Care (Signed)
Immediate Anesthesia Transfer of Care Note  Patient: Gail Holland  Procedure(s) Performed: Right shoulder arthroscopy with debridment, decompression, rotator cuff repair and possible biceps tenodesis (Right: Shoulder) STEROID INJECTION RIGHT HIP  Patient Location: PACU  Anesthesia Type:General  Level of Consciousness: awake, drowsy and patient cooperative  Airway & Oxygen Therapy: Patient Spontanous Breathing and Patient connected to face mask oxygen  Post-op Assessment: Report given to RN and Post -op Vital signs reviewed and stable  Post vital signs: Reviewed and stable  Last Vitals:  Vitals Value Taken Time  BP 140/83 09/18/21 1215  Temp    Pulse 87 09/18/21 1216  Resp    SpO2 99 % 09/18/21 1216  Vitals shown include unvalidated device data.  Last Pain:  Vitals:   09/18/21 0846  TempSrc: Temporal  PainSc: 1          Complications: No notable events documented.

## 2021-09-18 NOTE — Discharge Instructions (Addendum)
Orthopedic discharge instructions: Keep dressing dry and intact.  May shower after dressing changed on post-op day #4 (Monday).  Cover staples with Band-Aids after drying off. Apply ice frequently to shoulder. Resume meloxicam 15 mg daily with food (May take a second meloxicam for 3 to 5 days postop if necessary). Take oxycodone as prescribed when needed.  May supplement with ES Tylenol if necessary. Keep shoulder immobilizer on at all times except may remove for bathing purposes. Follow-up in 10-14 days or as scheduled.   AMBULATORY SURGERY  DISCHARGE INSTRUCTIONS   The drugs that you were given will stay in your system until tomorrow so for the next 24 hours you should not:  Drive an automobile Make any legal decisions Drink any alcoholic beverage   You may resume regular meals tomorrow.  Today it is better to start with liquids and gradually work up to solid foods.  You may eat anything you prefer, but it is better to start with liquids, then soup and crackers, and gradually work up to solid foods.   Please notify your doctor immediately if you have any unusual bleeding, trouble breathing, redness and pain at the surgery site, drainage, fever, or pain not relieved by medication.    Additional Instructions:        Please contact your physician with any problems or Same Day Surgery at [phone], Monday through Friday 6 am to 4 pm, or [place] at [facility] number at [phone].

## 2021-09-19 ENCOUNTER — Encounter: Payer: Self-pay | Admitting: Surgery

## 2021-10-20 ENCOUNTER — Other Ambulatory Visit: Payer: Self-pay | Admitting: Surgery

## 2021-10-20 DIAGNOSIS — M7061 Trochanteric bursitis, right hip: Secondary | ICD-10-CM

## 2021-11-03 ENCOUNTER — Ambulatory Visit
Admission: RE | Admit: 2021-11-03 | Discharge: 2021-11-03 | Disposition: A | Discharge status: Home / Self Care | Payer: BLUE CROSS/BLUE SHIELD | Source: Ambulatory Visit | Attending: Surgery | Admitting: Surgery

## 2021-11-03 DIAGNOSIS — M7061 Trochanteric bursitis, right hip: Secondary | ICD-10-CM
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
## 2022-04-29 ENCOUNTER — Other Ambulatory Visit: Payer: Self-pay | Admitting: Surgery

## 2022-05-01 ENCOUNTER — Other Ambulatory Visit (HOSPITAL_COMMUNITY): Payer: Self-pay | Admitting: Neurology

## 2022-05-01 ENCOUNTER — Other Ambulatory Visit: Payer: Self-pay | Admitting: Neurology

## 2022-05-01 DIAGNOSIS — R413 Other amnesia: Secondary | ICD-10-CM

## 2022-05-07 ENCOUNTER — Encounter
Admission: RE | Admit: 2022-05-07 | Discharge: 2022-05-07 | Disposition: A | Discharge status: Home / Self Care | Payer: BLUE CROSS/BLUE SHIELD | Source: Ambulatory Visit | Attending: Surgery | Admitting: Surgery

## 2022-05-07 VITALS — BP 141/91 | HR 86 | Resp 16 | Ht 65.0 in | Wt 194.0 lb

## 2022-05-07 DIAGNOSIS — Z01818 Encounter for other preprocedural examination: Secondary | ICD-10-CM | POA: Diagnosis present

## 2022-05-07 HISTORY — DX: Family history of other specified conditions: Z84.89

## 2022-05-07 LAB — URINALYSIS, ROUTINE W REFLEX MICROSCOPIC
Bilirubin Urine: NEGATIVE
Glucose, UA: NEGATIVE mg/dL
Hgb urine dipstick: NEGATIVE
Ketones, ur: NEGATIVE mg/dL
Leukocytes,Ua: NEGATIVE
Nitrite: NEGATIVE
Protein, ur: NEGATIVE mg/dL
Specific Gravity, Urine: 1.021 (ref 1.005–1.030)
pH: 6 (ref 5.0–8.0)

## 2022-05-07 LAB — SURGICAL PCR SCREEN
MRSA, PCR: NEGATIVE
Staphylococcus aureus: NEGATIVE

## 2022-05-07 LAB — TYPE AND SCREEN
ABO/RH(D): A NEG
Antibody Screen: NEGATIVE

## 2022-05-07 NOTE — Patient Instructions (Addendum)
Your procedure is scheduled on:05-19-22 Tuesday Report to the Registration Desk on the 1st floor of the [facility].Then proceed to the 2nd floor Surgery Desk To find out your arrival time, please call [phone] between 1PM - 3PM on:05-18-22 Monday If your arrival time is 6:00 am, do not arrive prior to that time as the [facility] entrance doors do not open until 6:00 am.  REMEMBER: Instructions that are not followed completely may result in serious medical risk, up to and including death; or upon the discretion of your surgeon and anesthesiologist your surgery may need to be rescheduled.  Do not eat food after midnight the night before surgery.  No gum chewing, lozengers or hard candies.  You may however, drink CLEAR liquids up to 2 hours before you are scheduled to arrive for your surgery. Do not drink anything within 2 hours of your scheduled arrival time.  Clear liquids include: - water  - apple juice without pulp - gatorade (not RED colors) - black coffee or tea (Do NOT add milk or creamers to the coffee or tea) Do NOT drink anything that is not on this list.  In addition, your doctor has ordered for you to drink the provided  Ensure Pre-Surgery Clear Carbohydrate Drink  Drinking this carbohydrate drink up to two hours before surgery helps to reduce insulin resistance and improve patient outcomes. Please complete drinking 2 hours prior to scheduled arrival time.  Do NOT take any medication the day of surgery  Stop your Phentermine 7 days prior to surgery-Your last dose will be on 05-11-22 Monday  One week prior to surgery: Stop Anti-inflammatories (NSAIDS) such as Meloxicam (Mobic) Advil, Aleve, Ibuprofen, Motrin, Naproxen, Naprosyn and Aspirin based products such as Excedrin, Goodys Powder, BC Powder.You may however, continue to take Tylenol if needed for pain up until the day of surgery.  Stop ANY OVER THE COUNTER supplements/vitamins 7 days prior to surgery (Vitamin D3,  B12, Multivitamin)  No Alcohol for 24 hours before or after surgery.  No Smoking including e-cigarettes for 24 hours prior to surgery.  No chewable tobacco products for at least 6 hours prior to surgery.  No nicotine patches on the day of surgery.  Do not use any "recreational" drugs for at least a week prior to your surgery.  Please be advised that the combination of cocaine and anesthesia may have negative outcomes, up to and including death. If you test positive for cocaine, your surgery will be cancelled.  On the morning of surgery brush your teeth with toothpaste and water, you may rinse your mouth with mouthwash if you wish. Do not swallow any toothpaste or mouthwash.  Use CHG Soap directed on instruction sheet.  Do not wear jewelry, make-up, hairpins, clips or nail polish.  Do not wear lotions, powders, or perfumes.   Do not shave body from the neck down 48 hours prior to surgery just in case you cut yourself which could leave a site for infection.  Also, freshly shaved skin may become irritated if using the CHG soap.  Contact lenses, hearing aids and dentures may not be worn into surgery.  Do not bring valuables to the hospital. [facility] is not responsible for any missing/lost belongings or valuables.   Notify your doctor if there is any change in your medical condition (cold, fever, infection).  Wear comfortable clothing (specific to your surgery type) to the hospital.  After surgery, you can help prevent lung complications by doing breathing exercises.  Take deep  breaths and cough every 1-2 hours. Your doctor may order a device called an Incentive Spirometer to help you take deep breaths. When coughing or sneezing, hold a pillow firmly against your incision with both hands. This is called "splinting." Doing this helps protect your incision. It also decreases belly discomfort.  If you are being admitted to the hospital overnight, leave your suitcase in the car. After  surgery it may be brought to your room.  If you are being discharged the day of surgery, you will not be allowed to drive home. You will need a responsible adult (18 years or older) to drive you home and stay with you that night.   If you are taking public transportation, you will need to have a responsible adult (18 years or older) with you. Please confirm with your physician that it is acceptable to use public transportation.   Please call the Pre-admissions Testing Dept. at [phone] if you have any questions about these instructions.  Surgery Visitation Policy:  Patients undergoing a surgery or procedure may have two family members or support persons with them as long as the person is not COVID-19 positive or experiencing its symptoms.   Inpatient Visitation:    Visiting hours are 7 a.m. to 8 p.m. Up to four visitors are allowed at one time in a patient room, including children. The visitors may rotate out with other people during the day. One designated support person (adult) may remain overnight.    How to Use an Incentive Spirometer An incentive spirometer is a tool that measures how well you are filling your lungs with each breath. Learning to take long, deep breaths using this tool can help you keep your lungs clear and active. This may help to reverse or lessen your chance of developing breathing (pulmonary) problems, especially infection. You may be asked to use a spirometer: After a surgery. If you have a lung problem or a history of smoking. After a long period of time when you have been unable to move or be active. If the spirometer includes an indicator to show the highest number that you have reached, your health care provider or respiratory therapist will help you set a goal. Keep a log of your progress as told by your health care provider. What are the risks? Breathing too quickly may cause dizziness or cause you to pass out. Take your time so you do not get dizzy or  light-headed. If you are in pain, you may need to take pain medicine before doing incentive spirometry. It is harder to take a deep breath if you are having pain. How to use your incentive spirometer  Sit up on the edge of your bed or on a chair. Hold the incentive spirometer so that it is in an upright position. Before you use the spirometer, breathe out normally. Place the mouthpiece in your mouth. Make sure your lips are closed tightly around it. Breathe in slowly and as deeply as you can through your mouth, causing the piston or the ball to rise toward the top of the chamber. Hold your breath for 3-5 seconds, or for as long as possible. If the spirometer includes a coach indicator, use this to guide you in breathing. Slow down your breathing if the indicator goes above the marked areas. Remove the mouthpiece from your mouth and breathe out normally. The piston or ball will return to the bottom of the chamber. Rest for a few seconds, then repeat the steps 10 or  more times. Take your time and take a few normal breaths between deep breaths so that you do not get dizzy or light-headed. Do this every 1-2 hours when you are awake. If the spirometer includes a goal marker to show the highest number you have reached (best effort), use this as a goal to work toward during each repetition. After each set of 10 deep breaths, cough a few times. This will help to make sure that your lungs are clear. If you have an incision on your chest or abdomen from surgery, place a pillow or a rolled-up towel firmly against the incision when you cough. This can help to reduce pain while taking deep breaths and coughing. General tips When you are able to get out of bed: Walk around often. Continue to take deep breaths and cough in order to clear your lungs. Keep using the incentive spirometer until your health care provider says it is okay to stop using it. If you have been in the hospital, you may be told to keep  using the spirometer at home. Contact a health care provider if: You are having difficulty using the spirometer. You have trouble using the spirometer as often as instructed. Your pain medicine is not giving enough relief for you to use the spirometer as told. You have a fever. Get help right away if: You develop shortness of breath. You develop a cough with bloody mucus from the lungs. You have fluid or blood coming from an incision site after you cough. Summary An incentive spirometer is a tool that can help you learn to take long, deep breaths to keep your lungs clear and active. You may be asked to use a spirometer after a surgery, if you have a lung problem or a history of smoking, or if you have been inactive for a long period of time. Use your incentive spirometer as instructed every 1-2 hours while you are awake. If you have an incision on your chest or abdomen, place a pillow or a rolled-up towel firmly against your incision when you cough. This will help to reduce pain. Get help right away if you have shortness of breath, you cough up bloody mucus, or blood comes from your incision when you cough. This information is not intended to replace advice given to you by your health care provider. Make sure you discuss any questions you have with your health care provider. Document Revised: 10/02/2019 Document Reviewed: 10/02/2019 Elsevier Patient Education  2023 [employer].

## 2022-05-09 ENCOUNTER — Ambulatory Visit (HOSPITAL_COMMUNITY)
Admission: RE | Admit: 2022-05-09 | Discharge: 2022-05-09 | Disposition: A | Discharge status: Home / Self Care | Payer: BLUE CROSS/BLUE SHIELD | Source: Ambulatory Visit | Attending: Neurology | Admitting: Neurology

## 2022-05-09 DIAGNOSIS — R413 Other amnesia: Secondary | ICD-10-CM | POA: Diagnosis present

## 2022-05-19 ENCOUNTER — Other Ambulatory Visit: Payer: Self-pay

## 2022-05-19 ENCOUNTER — Ambulatory Visit: Payer: BLUE CROSS/BLUE SHIELD | Admitting: Certified Registered"

## 2022-05-19 ENCOUNTER — Encounter
Admission: RE | Disposition: A | Discharge status: Home / Self Care | Payer: Self-pay | Source: Home / Self Care | Attending: Surgery

## 2022-05-19 ENCOUNTER — Ambulatory Visit: Payer: BLUE CROSS/BLUE SHIELD | Admitting: Urgent Care

## 2022-05-19 ENCOUNTER — Ambulatory Visit: Payer: BLUE CROSS/BLUE SHIELD

## 2022-05-19 ENCOUNTER — Ambulatory Visit
Admission: RE | Admit: 2022-05-19 | Discharge: 2022-05-19 | Disposition: A | Discharge status: Home / Self Care | Payer: BLUE CROSS/BLUE SHIELD | Attending: Surgery | Admitting: Surgery

## 2022-05-19 ENCOUNTER — Encounter: Payer: Self-pay | Admitting: Surgery

## 2022-05-19 DIAGNOSIS — Z87891 Personal history of nicotine dependence: Secondary | ICD-10-CM | POA: Insufficient documentation

## 2022-05-19 DIAGNOSIS — R413 Other amnesia: Secondary | ICD-10-CM | POA: Diagnosis not present

## 2022-05-19 DIAGNOSIS — M1611 Unilateral primary osteoarthritis, right hip: Secondary | ICD-10-CM | POA: Diagnosis present

## 2022-05-19 DIAGNOSIS — F32A Depression, unspecified: Secondary | ICD-10-CM | POA: Insufficient documentation

## 2022-05-19 HISTORY — PX: TOTAL HIP ARTHROPLASTY: SHX124

## 2022-05-19 LAB — ABO/RH: ABO/RH(D): A NEG

## 2022-05-19 SURGERY — ARTHROPLASTY, HIP, TOTAL,POSTERIOR APPROACH
Anesthesia: Spinal | Site: Hip | Laterality: Right

## 2022-05-19 MED ORDER — FAMOTIDINE 20 MG PO TABS
ORAL_TABLET | ORAL | Status: AC
  Administered 2022-05-19: 20 mg via ORAL
  Filled 2022-05-19: qty 1

## 2022-05-19 MED ORDER — ONDANSETRON HCL 4 MG/2ML IJ SOLN: 4.0000 mg | Freq: Four times a day (QID) | INTRAMUSCULAR | Status: DC | PRN

## 2022-05-19 MED ORDER — SODIUM CHLORIDE FLUSH 0.9 % IV SOLN
INTRAVENOUS | Status: AC
  Filled 2022-05-19: qty 40

## 2022-05-19 MED ORDER — PENTAFLUOROPROP-TETRAFLUOROETH EX AERO
INHALATION_SPRAY | CUTANEOUS | Status: AC
  Filled 2022-05-19: qty 30

## 2022-05-19 MED ORDER — PROPOFOL 500 MG/50ML IV EMUL
INTRAVENOUS | Status: DC | PRN
  Administered 2022-05-19: 80 ug/kg/min via INTRAVENOUS

## 2022-05-19 MED ORDER — PROPOFOL 10 MG/ML IV BOLUS
INTRAVENOUS | Status: DC | PRN
  Administered 2022-05-19 (×2): 50 mg via INTRAVENOUS

## 2022-05-19 MED ORDER — METOCLOPRAMIDE HCL 10 MG PO TABS: 5.0000 mg | ORAL_TABLET | Freq: Three times a day (TID) | ORAL | Status: DC | PRN

## 2022-05-19 MED ORDER — CEFAZOLIN SODIUM-DEXTROSE 2-4 GM/100ML-% IV SOLN
INTRAVENOUS | Status: AC
  Filled 2022-05-19: qty 100

## 2022-05-19 MED ORDER — SODIUM CHLORIDE 0.9 % BOLUS PEDS
250.0000 mL | Freq: Once | INTRAVENOUS | Status: AC
  Administered 2022-05-19: 250 mL via INTRAVENOUS

## 2022-05-19 MED ORDER — FENTANYL CITRATE (PF) 100 MCG/2ML IJ SOLN
INTRAMUSCULAR | Status: DC | PRN
  Administered 2022-05-19 (×2): 50 ug via INTRAVENOUS

## 2022-05-19 MED ORDER — SODIUM CHLORIDE 0.9 % IV SOLN: INTRAVENOUS | Status: DC

## 2022-05-19 MED ORDER — OXYCODONE HCL 5 MG PO TABS
5.0000 mg | ORAL_TABLET | ORAL | Status: DC | PRN
  Administered 2022-05-19: 10 mg via ORAL

## 2022-05-19 MED ORDER — PHENYLEPHRINE HCL-NACL 20-0.9 MG/250ML-% IV SOLN
INTRAVENOUS | Status: DC | PRN
  Administered 2022-05-19: 20 ug/min via INTRAVENOUS

## 2022-05-19 MED ORDER — HYDROMORPHONE HCL 1 MG/ML IJ SOLN: 0.2500 mg | INTRAMUSCULAR | Status: DC | PRN

## 2022-05-19 MED ORDER — KETOROLAC TROMETHAMINE 30 MG/ML IJ SOLN
INTRAMUSCULAR | Status: AC
  Administered 2022-05-19: 30 mg via INTRAVENOUS
  Filled 2022-05-19: qty 1

## 2022-05-19 MED ORDER — MIDAZOLAM HCL 2 MG/2ML IJ SOLN
INTRAMUSCULAR | Status: AC
  Filled 2022-05-19: qty 2

## 2022-05-19 MED ORDER — OXYCODONE HCL 5 MG PO TABS: 5.0000 mg | ORAL_TABLET | ORAL | 0 refills | Status: AC | PRN

## 2022-05-19 MED ORDER — METOCLOPRAMIDE HCL 5 MG/ML IJ SOLN: 5.0000 mg | Freq: Three times a day (TID) | INTRAMUSCULAR | Status: DC | PRN

## 2022-05-19 MED ORDER — MIDAZOLAM HCL 5 MG/5ML IJ SOLN
INTRAMUSCULAR | Status: DC | PRN
  Administered 2022-05-19: 2 mg via INTRAVENOUS

## 2022-05-19 MED ORDER — TRIAMCINOLONE ACETONIDE 40 MG/ML IJ SUSP
INTRAMUSCULAR | Status: DC | PRN
  Administered 2022-05-19: 93 mL

## 2022-05-19 MED ORDER — OXYCODONE HCL 5 MG PO TABS: 5.0000 mg | ORAL_TABLET | Freq: Once | ORAL | Status: DC | PRN

## 2022-05-19 MED ORDER — CHLORHEXIDINE GLUCONATE 0.12 % MT SOLN
OROMUCOSAL | Status: AC
  Administered 2022-05-19: 15 mL via OROMUCOSAL
  Filled 2022-05-19: qty 15

## 2022-05-19 MED ORDER — CEFAZOLIN SODIUM-DEXTROSE 2-4 GM/100ML-% IV SOLN
2.0000 g | INTRAVENOUS | Status: AC
  Administered 2022-05-19: 2 g via INTRAVENOUS

## 2022-05-19 MED ORDER — FAMOTIDINE 20 MG PO TABS: 20.0000 mg | ORAL_TABLET | Freq: Once | ORAL | Status: AC

## 2022-05-19 MED ORDER — APIXABAN 2.5 MG PO TABS: 2.5000 mg | ORAL_TABLET | Freq: Two times a day (BID) | ORAL | 0 refills | Status: AC

## 2022-05-19 MED ORDER — CHLORHEXIDINE GLUCONATE 0.12 % MT SOLN: 15.0000 mL | Freq: Once | OROMUCOSAL | Status: AC

## 2022-05-19 MED ORDER — ACETAMINOPHEN 10 MG/ML IV SOLN
INTRAVENOUS | Status: AC
  Filled 2022-05-19: qty 100

## 2022-05-19 MED ORDER — ONDANSETRON HCL 4 MG PO TABS: 4.0000 mg | ORAL_TABLET | Freq: Four times a day (QID) | ORAL | Status: DC | PRN

## 2022-05-19 MED ORDER — 0.9 % SODIUM CHLORIDE (POUR BTL) OPTIME
TOPICAL | Status: DC | PRN
  Administered 2022-05-19: 500 mL

## 2022-05-19 MED ORDER — OXYCODONE HCL 5 MG/5ML PO SOLN: 5.0000 mg | Freq: Once | ORAL | Status: DC | PRN

## 2022-05-19 MED ORDER — BUPIVACAINE LIPOSOME 1.3 % IJ SUSP
INTRAMUSCULAR | Status: AC
  Filled 2022-05-19: qty 20

## 2022-05-19 MED ORDER — TRANEXAMIC ACID 1000 MG/10ML IV SOLN
INTRAVENOUS | Status: DC | PRN
  Administered 2022-05-19: 1000 mg via TOPICAL

## 2022-05-19 MED ORDER — PROPOFOL 1000 MG/100ML IV EMUL
INTRAVENOUS | Status: AC
  Filled 2022-05-19: qty 100

## 2022-05-19 MED ORDER — TRANEXAMIC ACID 1000 MG/10ML IV SOLN
INTRAVENOUS | Status: AC
  Filled 2022-05-19: qty 10

## 2022-05-19 MED ORDER — ONDANSETRON HCL 4 MG/2ML IJ SOLN
INTRAMUSCULAR | Status: DC | PRN
  Administered 2022-05-19: 4 mg via INTRAVENOUS

## 2022-05-19 MED ORDER — ORAL CARE MOUTH RINSE: 15.0000 mL | Freq: Once | OROMUCOSAL | Status: AC

## 2022-05-19 MED ORDER — OXYCODONE HCL 5 MG PO TABS
ORAL_TABLET | ORAL | Status: AC
  Filled 2022-05-19: qty 1

## 2022-05-19 MED ORDER — LACTATED RINGERS IV SOLN: INTRAVENOUS | Status: DC

## 2022-05-19 MED ORDER — BUPIVACAINE HCL (PF) 0.5 % IJ SOLN
INTRAMUSCULAR | Status: DC | PRN
  Administered 2022-05-19: 3 mL

## 2022-05-19 MED ORDER — SODIUM CHLORIDE 0.9 % IR SOLN
Status: DC | PRN
  Administered 2022-05-19: 3000 mL

## 2022-05-19 MED ORDER — FENTANYL CITRATE (PF) 100 MCG/2ML IJ SOLN
INTRAMUSCULAR | Status: AC
  Filled 2022-05-19: qty 2

## 2022-05-19 MED ORDER — TRIAMCINOLONE ACETONIDE 40 MG/ML IJ SUSP
INTRAMUSCULAR | Status: AC
  Filled 2022-05-19: qty 2

## 2022-05-19 MED ORDER — KETOROLAC TROMETHAMINE 30 MG/ML IJ SOLN: 30.0000 mg | Freq: Once | INTRAMUSCULAR | Status: AC

## 2022-05-19 MED ORDER — BUPIVACAINE-EPINEPHRINE (PF) 0.5% -1:200000 IJ SOLN
INTRAMUSCULAR | Status: AC
  Filled 2022-05-19: qty 30

## 2022-05-19 MED ORDER — OXYCODONE HCL 5 MG PO TABS
ORAL_TABLET | ORAL | Status: AC
  Filled 2022-05-19: qty 2

## 2022-05-19 MED ORDER — CEFAZOLIN SODIUM-DEXTROSE 2-4 GM/100ML-% IV SOLN: 2.0000 g | Freq: Four times a day (QID) | INTRAVENOUS | Status: DC

## 2022-05-19 MED ORDER — ACETAMINOPHEN 10 MG/ML IV SOLN
INTRAVENOUS | Status: DC | PRN
  Administered 2022-05-19: 1000 mg via INTRAVENOUS

## 2022-05-19 MED ORDER — CEFAZOLIN SODIUM-DEXTROSE 2-4 GM/100ML-% IV SOLN
INTRAVENOUS | Status: AC
  Administered 2022-05-19: 2 g via INTRAVENOUS
  Filled 2022-05-19: qty 100

## 2022-05-19 SURGICAL SUPPLY — 56 items
BIT DRILL JC 5IN 2.4M 127 24FL (BIT) IMPLANT
BLADE SAGITTAL WIDE XTHICK NO (BLADE) ×1 IMPLANT
BLADE SURG SZ20 CARB STEEL (BLADE) ×1 IMPLANT
CHLORAPREP W/TINT 26 (MISCELLANEOUS) ×1 IMPLANT
DRAPE 3/4 80X56 (DRAPES) ×1 IMPLANT
DRAPE IMP U-DRAPE 54X76 (DRAPES) IMPLANT
DRAPE INCISE IOBAN 66X60 STRL (DRAPES) ×1 IMPLANT
DRAPE SURG 17X11 SM STRL (DRAPES) ×2 IMPLANT
DRSG MEPILEX SACRM 8.7X9.8 (GAUZE/BANDAGES/DRESSINGS) ×1 IMPLANT
DRSG OPSITE POSTOP 4X10 (GAUZE/BANDAGES/DRESSINGS) ×1 IMPLANT
ELECT CAUTERY BLADE 6.4 (BLADE) ×1 IMPLANT
GAUZE 4X4 16PLY ~~LOC~~+RFID DBL (SPONGE) ×1 IMPLANT
GAUZE XEROFORM 1X8 LF (GAUZE/BANDAGES/DRESSINGS) ×1 IMPLANT
GLOVE BIO SURGEON STRL SZ7.5 (GLOVE) ×4 IMPLANT
GLOVE BIO SURGEON STRL SZ8 (GLOVE) ×4 IMPLANT
GLOVE BIOGEL PI IND STRL 8 (GLOVE) ×1 IMPLANT
GLOVE SURG UNDER LTX SZ8 (GLOVE) ×1 IMPLANT
GOWN STRL REUS W/ TWL LRG LVL3 (GOWN DISPOSABLE) ×1 IMPLANT
GOWN STRL REUS W/ TWL XL LVL3 (GOWN DISPOSABLE) ×1 IMPLANT
GOWN STRL REUS W/TWL LRG LVL3 (GOWN DISPOSABLE) ×1
GOWN STRL REUS W/TWL XL LVL3 (GOWN DISPOSABLE) ×1
HEAD CERAMIC BIOLOX 32 TP1 -3 (Head) IMPLANT
HIP SHELL ACETAB 3H 48MM C (Hips) ×1 IMPLANT
HOLSTER ELECTROSUGICAL PENCIL (MISCELLANEOUS) ×1 IMPLANT
HOOD PEEL AWAY FLYTE STAYCOOL (MISCELLANEOUS) ×3 IMPLANT
IV NS IRRIG 3000ML ARTHROMATIC (IV SOLUTION) ×1 IMPLANT
KIT TURNOVER KIT A (KITS) ×1 IMPLANT
LINER ACE G7 32 SZC HIGH WALL (Liner) IMPLANT
MANIFOLD NEPTUNE II (INSTRUMENTS) ×1 IMPLANT
NDL FILTER BLUNT 18X1 1/2 (NEEDLE) ×1 IMPLANT
NDL SAFETY ECLIP 18X1.5 (MISCELLANEOUS) ×2 IMPLANT
NDL SPNL 20GX3.5 QUINCKE YW (NEEDLE) ×1 IMPLANT
NEEDLE FILTER BLUNT 18X1 1/2 (NEEDLE) ×1 IMPLANT
NEEDLE SPNL 20GX3.5 QUINCKE YW (NEEDLE) ×1 IMPLANT
PACK HIP PROSTHESIS (MISCELLANEOUS) ×1 IMPLANT
PENCIL SMOKE EVACUATOR (MISCELLANEOUS) ×1 IMPLANT
PIN STEIN SMOOTH 3/16X9 (Pin) IMPLANT
PIN STEIN SMOOTH 4.8X9 (Pin) ×1 IMPLANT
PIN STEINMAN 3/16 (PIN) ×1 IMPLANT
PULSAVAC PLUS IRRIG FAN TIP (DISPOSABLE) ×1
SHELL ACETAB HIP 3H 48MM C (Hips) IMPLANT
SPONGE T-LAP 18X18 ~~LOC~~+RFID (SPONGE) ×5 IMPLANT
STAPLER SKIN PROX 35W (STAPLE) ×1 IMPLANT
STEM COLLARLESS FULL 9X125 (Stem) IMPLANT
SUT TICRON 2-0 30IN 311381 (SUTURE) ×3 IMPLANT
SUT VIC AB 0 CT1 36 (SUTURE) ×1 IMPLANT
SUT VIC AB 1 CT1 36 (SUTURE) ×1 IMPLANT
SUT VIC AB 2-0 CT1 (SUTURE) ×3 IMPLANT
SYR 10ML LL (SYRINGE) ×1 IMPLANT
SYR 20ML LL LF (SYRINGE) ×1 IMPLANT
SYR 30ML LL (SYRINGE) ×2 IMPLANT
TAPE TRANSPORE STRL 2 31045 (GAUZE/BANDAGES/DRESSINGS) ×1 IMPLANT
TIP FAN IRRIG PULSAVAC PLUS (DISPOSABLE) ×1 IMPLANT
TRAP FLUID SMOKE EVACUATOR (MISCELLANEOUS) ×2 IMPLANT
WATER STERILE IRR 1000ML POUR (IV SOLUTION) ×1 IMPLANT
WATER STERILE IRR 500ML POUR (IV SOLUTION) ×1 IMPLANT

## 2022-05-19 NOTE — Anesthesia Procedure Notes (Signed)
Spinal  Patient location during procedure: OR Start time: 05/19/2022 10:35 AM End time: 05/19/2022 10:47 AM Staffing Performed: resident/CRNA  Anesthesiologist: Ilene Qua, MD Resident/CRNA: Esaw Grandchild, CRNA Performed by: Esaw Grandchild, CRNA Authorized by: Ilene Qua, MD   Preanesthetic Checklist Completed: patient identified, IV checked, site marked, risks and benefits discussed, surgical consent, monitors and equipment checked, pre-op evaluation and timeout performed Spinal Block Patient position: sitting Prep: ChloraPrep Patient monitoring: heart rate, continuous pulse ox and blood pressure Approach: midline Location: L3-4 Injection technique: single-shot Needle Needle type: Pencan  Needle gauge: 24 G Needle length: 9 cm Assessment Sensory level: T4 Events: CSF return

## 2022-05-19 NOTE — TOC Progression Note (Signed)
Transition of Care [facility]) - Progression Note    Patient Details  Name: Gail Holland MRN: [id number] Date of Birth: 1966-06-04  [facility] CM/SW [place], RN Phone Number: 05/19/2022, 2:52 PM  Clinical Narrative:     Patient is set up with [place] [facility] by the surgeons office prior to Surgery, the patient wants a wheelchair and has a 3 In 1 at home, [employer] to deliver to the bedside       Expected Discharge Plan and Services           Expected Discharge Date: 05/19/22                                     Social Determinants of Health (SDOH) Interventions    Readmission Risk Interventions     No data to display
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
## 2022-05-19 NOTE — Discharge Instructions (Addendum)
Orthopedic discharge instructions: May shower with intact OpSite dressing. Apply ice frequently to hip area. Start Eliquis 1 tablet (2.5 mg) twice daily on Wednesday, 05/20/2022, for 2 weeks, then take aspirin 325 mg twice daily for 4 weeks. Take pain medication as prescribed when needed.  May supplement with ES Tylenol if necessary. May weight-bear as tolerated on right leg - use walker for balance and support. Follow-up in 10-14 days or as scheduled.  AMBULATORY SURGERY  DISCHARGE INSTRUCTIONS   The drugs that you were given will stay in your system until tomorrow so for the next 24 hours you should not:  Drive an automobile Make any legal decisions Drink any alcoholic beverage   You may resume regular meals tomorrow.  Today it is better to start with liquids and gradually work up to solid foods.  You may eat anything you prefer, but it is better to start with liquids, then soup and crackers, and gradually work up to solid foods.   Please notify your doctor immediately if you have any unusual bleeding, trouble breathing, redness and pain at the surgery site, drainage, fever, or pain not relieved by medication.    Additional Instructions:        Please contact your physician with any problems or Same Day Surgery at [phone], Monday through Friday 6 am to 4 pm, or [place] at [facility] number at [phone].

## 2022-05-19 NOTE — Anesthesia Preprocedure Evaluation (Addendum)
Anesthesia Evaluation  Patient identified by MRN, date of birth, ID band Patient awake    Reviewed: Allergy & Precautions, NPO status , Patient's Chart, lab work & pertinent test results  History of Anesthesia Complications Negative for: history of anesthetic complications  Airway Mallampati: II  TM Distance: >3 FB Neck ROM: full    Dental  (+) Teeth Intact   Pulmonary neg pulmonary ROS, former smoker,    Pulmonary exam normal        Cardiovascular Exercise Tolerance: Good negative cardio ROS Normal cardiovascular exam     Neuro/Psych PSYCHIATRIC DISORDERS Depression negative neurological ROS     GI/Hepatic negative GI ROS, Neg liver ROS,   Endo/Other  negative endocrine ROS  Renal/GU negative Renal ROS     Musculoskeletal  (+) Arthritis ,   Abdominal   Peds  Hematology negative hematology ROS (+)   Anesthesia Other Findings Past Medical History: No date: Arthritis No date: Depression No date: Family history of adverse reaction to anesthesia     Comment:  pt is adopted No date: History of kidney stones  Past Surgical History: No date: ANKLE FRACTURE SURGERY; Right No date: CHOLECYSTECTOMY 09/18/2021: SHOULDER ARTHROSCOPY WITH DEBRIDEMENT AND BICEP TENDON  REPAIR; Right     Comment:  Procedure: Extensive arthroscopic debridement,               arthroscopic repair of subscapularis tendon tear,               arthroscopic subacromial decompression, and mini-open               biceps tenodesis, right shoulder;  Surgeon: Corky Mull, MD;  Location: [facility];  Service: Orthopedics;                Laterality: Right; 09/18/2021: STERIOD INJECTION     Comment:  Procedure: STEROID INJECTION RIGHT HIP;  Surgeon: Corky Mull, MD;  Location: [facility];  Service: Orthopedics;; No date: WISDOM TOOTH EXTRACTION  BMI    Body Mass Index: 32.28 kg/m       Reproductive/Obstetrics negative OB ROS                            Anesthesia Physical Anesthesia Plan  ASA: 2  Anesthesia Plan: General/Spinal   Post-op Pain Management: Dilaudid IV and Toradol IV (intra-op)*   Induction: Intravenous  PONV Risk Score and Plan: 3 and Propofol infusion, TIVA and Treatment may vary due to age or medical condition  Airway Management Planned: Natural Airway and Nasal Cannula  Additional Equipment:   Intra-op Plan:   Post-operative Plan:   Informed Consent: I have reviewed the patients History and Physical, chart, labs and discussed the procedure including the risks, benefits and alternatives for the proposed anesthesia with the patient or authorized representative who has indicated his/her understanding and acceptance.     Dental Advisory Given  Plan Discussed with: Anesthesiologist, CRNA and Surgeon  Anesthesia Plan Comments: (Patient consented for risks of anesthesia including but not limited to:  - adverse reactions to medications - risk of airway placement if required - damage to eyes, teeth, lips or other oral mucosa - nerve damage due to positioning  - sore throat or hoarseness - Damage to heart, brain, nerves, lungs, other parts of body or loss  of life  Patient voiced understanding.)        Anesthesia Quick Evaluation

## 2022-05-19 NOTE — Anesthesia Postprocedure Evaluation (Signed)
Anesthesia Post Note  Patient: Gail Holland  Procedure(s) Performed: TOTAL HIP ARTHROPLASTY (Right: Hip)  Patient location during evaluation: PACU Anesthesia Type: Combined General/Spinal Level of consciousness: awake and alert Pain management: pain level controlled Vital Signs Assessment: post-procedure vital signs reviewed and stable Respiratory status: spontaneous breathing, nonlabored ventilation, respiratory function stable and patient connected to nasal cannula oxygen Cardiovascular status: blood pressure returned to baseline and stable Postop Assessment: no apparent nausea or vomiting Anesthetic complications: no   No notable events documented.   Last Vitals:  Vitals:   05/19/22 1330 05/19/22 1345  BP: 114/73 116/81  Pulse: (!) 56 (!) 56  Resp: 13 14  Temp:    SpO2: 100% 100%    Last Pain:  Vitals:   05/19/22 1330  TempSrc:   PainSc: 0-No pain                 Ilene Qua

## 2022-05-19 NOTE — Transfer of Care (Signed)
Immediate Anesthesia Transfer of Care Note  Patient: Gail Holland  Procedure(s) Performed: TOTAL HIP ARTHROPLASTY (Right: Hip)  Patient Location: PACU  Anesthesia Type:General and Spinal  Level of Consciousness: awake, alert  and oriented  Airway & Oxygen Therapy: Patient Spontanous Breathing and Patient connected to face mask oxygen  Post-op Assessment: Report given to RN and Post -op Vital signs reviewed and stable  Post vital signs: Reviewed and stable  Last Vitals:  Vitals Value Taken Time  BP 109/95 05/19/22 1309  Temp    Pulse 62 05/19/22 1312  Resp 17 05/19/22 1312  SpO2 100 % 05/19/22 1312  Vitals shown include unvalidated device data.  Last Pain:  Vitals:   05/19/22 0925  TempSrc: Temporal  PainSc: 2          Complications: No notable events documented.
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
## 2022-05-19 NOTE — Progress Notes (Cosign Needed)
Patient suffers from Hip Pain from Total Hip surgery and Arthritis which impairs their ability to perform daily activities like ADLs in the home.  A Walking aide will not resolve issue with performing activities of daily living. A wheelchair will allow patient to safely perform daily activities. Patient can safely propel the wheelchair in the home or has a caregiver who can provide assistance. Length of need 6 months. Accessories: elevating leg rests (ELRs), wheel locks, extensions and anti-tippers, back cushion

## 2022-05-19 NOTE — Op Note (Signed)
05/19/2022  12:56 PM  Patient:   Gail Holland  Pre-Op Diagnosis:   Degenerative joint disease, right hip.  Post-Op Diagnosis:   Same.  Procedure:   Right total hip arthroplasty.  Surgeon:   Pascal Lux, MD  Assistant:   Cameron Proud, PA-C; Reymundo Poll, PA-S  Anesthesia:   Spinal  Findings:   As above.  Complications:   None  EBL:   225 cc  Fluids:   900 cc crystalloid  UOP:   None  TT:   None  Drains:   None  Closure:   Staples  Implants:   Biomet press-fit system with a #9 laterally offset Echo femoral stem, a 48 mm acetabular shell with an E-poly hi-wall liner, and a 32 mm ceramic head with a -3 mm neck.  Brief Clinical Note:   The patient is a 56 year old female with a history of progressively worsening right hip/groin pain. Her symptoms have persisted despite medications, activity modification, etc. Her history and examination are consistent with degenerative joint disease of the right hip, confirmed by both plain radiographs and MRI scan. She presents at this time for a right total hip arthroplasty.   Procedure:   The patient was brought into the operating room. After adequate spinal anesthesia was obtained, the patient was repositioned in the left lateral decubitus position and secured using a lateral hip positioner. The right hip and lower extremity were prepped with ChloroPrep solution before being draped sterilely. Preoperative antibiotics were administered. A timeout was performed to verify the appropriate surgical site.    A standard posterior approach to the hip was made through an approximately 4-5 inch incision. The incision was carried down through the subcutaneous tissues to expose the gluteal fascia and proximal end of the iliotibial band. These structures were split the length of the incision and the Charnley self-retaining hip retractor placed. The bursal tissues were swept posteriorly to expose the short external rotators. The anterior border of  the piriformis tendon was identified and this plane developed down through the capsule to enter the joint. A flap of tissue was elevated off the posterior aspect of the femoral neck and greater trochanter and retracted posteriorly. This flap included the piriformis tendon, the short external rotators, and the posterior capsule. The soft tissues were elevated off the lateral aspect of the ilium and a large Steinmann pin placed bicortically.   With the right leg aligned over the left, a drill bit was placed into the greater trochanter parallel to the Steinmann pin and the distance between these two pins measured in order to optimize leg lengths postoperatively. The drill bit was removed and the hip dislocated. The piriformis fossa was debrided of soft tissues before the intramedullary canal was accessed through this point using a triple step reamer. The canal was reamed sequentially beginning with a #7 tapered reamer and progressing to a #9 tapered reamer. This provided excellent circumferential chatter. Using the appropriate guide, a femoral neck cut was made 10-12 mm above the lesser trochanter. The femoral head was removed.  Attention was directed to the acetabular side. The labrum was debrided circumferentially before the ligamentum teres was removed using a large curette. A line was drawn on the drapes corresponding to the native version of the acetabulum. This line was used as a guide while the acetabulum was reamed sequentially beginning with a 41 mm reamer and progressing to a 47 mm reamer. This provided excellent circumferential chatter. The 47 mm trial acetabulum was positioned and found to  fit quite well. Therefore, the 48 mm acetabular shell was selected and impacted into place with care taken to maintain the appropriate version. The trial high wall liner was inserted.  Attention was redirected to the femoral side. A box osteotome was used to establish version before the canal was broached  sequentially beginning with a #7 broach and progressing to a #9 broach. This was left in place and several trial reductions performed using both the standard and laterally offset neck options, as well as the -6 mm and -3 mm neck lengths. After removing the trial components, the "manhole cover" was placed into the apex of the acetabular shell and tightened securely. The permanent E-polyethylene hi-wall liner was impacted into the acetabular shell and its locking mechanism verified using a quarter-inch osteotome. Next, the #9 laterally offset femoral stem was impacted into place with care taken to maintain the appropriate version. A repeat trial reduction was performed using the -3 mm neck length. The -3 mm neck length demonstrated excellent stability both in extension and external rotation as well as with flexion to 90 and internal rotation beyond 70. It also was stable in the position of sleep. In addition, leg lengths appeared to be restored appropriately, both by reassessing the position of the right leg over the left, as well as by measuring the distance between the Steinmann pin and the drill bit. The 32 mm ceramic head with the -3 mm neck was impacted onto the stem of the femoral component. The Morse taper locking mechanism was verified using manual distraction before the head was relocated and placed through a range of motion with the findings as described above.  The wound was copiously irrigated with sterile saline solution via the jet lavage system before the peri-incisional and pericapsular tissues were injected with a solution comprised of 30 cc of 0.5% Sensorcaine with epinephrine, 20 cc of Exparel, 2 cc of Kenalog 40 (80 mg), and 15 mg of Toradol diluted out to 90 cc with normal saline to help with postoperative analgesia. The posterior flap was reapproximated to the posterior aspect of the greater trochanter using #2 Tycron interrupted sutures placed through drill holes. Several additional #2  Tycron interrupted sutures were used to reinforce this layer of closure. The iliotibial band was reapproximated using #1 Vicryl interrupted sutures before the gluteal fascia was closed using a running #1 Vicryl suture. At this point, 1 g of transexemic acid in 10 cc of normal saline was injected into the joint to help reduce postoperative bleeding. The subcutaneous tissues were closed in several layers using 2-0 Vicryl interrupted sutures before the skin was closed using staples. A sterile occlusive dressing was applied to the wound. The patient was then rolled back into the supine position on his/her hospital bed before being awakened and returned to the recovery room in satisfactory condition after tolerating the procedure well.

## 2022-05-20 ENCOUNTER — Encounter: Payer: Self-pay | Admitting: Surgery

## 2022-05-21 ENCOUNTER — Other Ambulatory Visit: Payer: Self-pay | Admitting: Family Medicine

## 2022-05-21 DIAGNOSIS — E041 Nontoxic single thyroid nodule: Secondary | ICD-10-CM

## 2022-05-21 LAB — SURGICAL PATHOLOGY

## 2022-06-04 ENCOUNTER — Ambulatory Visit: Payer: BLUE CROSS/BLUE SHIELD

## 2022-07-03 ENCOUNTER — Ambulatory Visit
Admission: RE | Admit: 2022-07-03 | Discharge: 2022-07-03 | Disposition: A | Discharge status: Home / Self Care | Payer: BLUE CROSS/BLUE SHIELD | Source: Ambulatory Visit | Attending: Family Medicine | Admitting: Family Medicine

## 2022-07-03 DIAGNOSIS — E041 Nontoxic single thyroid nodule: Secondary | ICD-10-CM | POA: Diagnosis present

## 2022-08-19 ENCOUNTER — Other Ambulatory Visit: Payer: Self-pay | Admitting: Podiatry

## 2022-08-19 DIAGNOSIS — M19171 Post-traumatic osteoarthritis, right ankle and foot: Secondary | ICD-10-CM

## 2022-08-19 DIAGNOSIS — M79671 Pain in right foot: Secondary | ICD-10-CM

## 2022-08-26 ENCOUNTER — Ambulatory Visit
Admission: RE | Admit: 2022-08-26 | Discharge: 2022-08-26 | Disposition: A | Discharge status: Home / Self Care | Payer: BLUE CROSS/BLUE SHIELD | Source: Ambulatory Visit | Attending: Podiatry | Admitting: Podiatry

## 2022-08-26 DIAGNOSIS — M79671 Pain in right foot: Secondary | ICD-10-CM

## 2022-08-26 DIAGNOSIS — M19171 Post-traumatic osteoarthritis, right ankle and foot: Secondary | ICD-10-CM

## 2023-04-23 DIAGNOSIS — E785 Hyperlipidemia, unspecified: Secondary | ICD-10-CM | POA: Diagnosis not present

## 2023-04-23 DIAGNOSIS — Z0000 Encounter for general adult medical examination without abnormal findings: Secondary | ICD-10-CM | POA: Diagnosis not present
